# Patient Record
Sex: Female | Born: 1940 | Race: White | Hispanic: No | Marital: Married | Smoking: Never smoker
Health system: Southern US, Community
[De-identification: ages and names within clinical notes are randomized; demographics above are authoritative.]

## PROBLEM LIST (undated history)

## (undated) DIAGNOSIS — I10 Essential (primary) hypertension: Secondary | ICD-10-CM

## (undated) DIAGNOSIS — Z8619 Personal history of other infectious and parasitic diseases: Secondary | ICD-10-CM

## (undated) HISTORY — PX: FOOT SURGERY: SHX648

## (undated) HISTORY — PX: KNEE ARTHROSCOPY: SUR90

## (undated) HISTORY — PX: APPENDECTOMY: SHX54

## (undated) HISTORY — PX: NOSE SURGERY: SHX723

---

## 1966-05-28 HISTORY — PX: NEPHRECTOMY: SHX65

## 2004-05-28 DIAGNOSIS — Z8619 Personal history of other infectious and parasitic diseases: Secondary | ICD-10-CM

## 2004-05-28 HISTORY — DX: Personal history of other infectious and parasitic diseases: Z86.19

## 2008-06-21 ENCOUNTER — Emergency Department: Payer: Self-pay | Admitting: Internal Medicine

## 2008-06-30 ENCOUNTER — Ambulatory Visit: Payer: Self-pay | Admitting: Otolaryngology

## 2010-05-31 ENCOUNTER — Ambulatory Visit: Payer: Self-pay | Admitting: Podiatry

## 2014-09-11 ENCOUNTER — Emergency Department
Admit: 2014-09-11 | Disposition: A | Discharge status: Other Acute Inpatient Hospital | Payer: Self-pay | Admitting: Emergency Medicine

## 2014-09-11 ENCOUNTER — Encounter (HOSPITAL_COMMUNITY): Payer: Self-pay | Admitting: *Deleted

## 2014-09-11 ENCOUNTER — Inpatient Hospital Stay (HOSPITAL_COMMUNITY)
Admission: AD | Admit: 2014-09-11 | Discharge: 2014-09-12 | DRG: 084 | Disposition: A | Discharge status: Home / Self Care | Payer: Medicare Other | Source: Other Acute Inpatient Hospital | Attending: Neurosurgery | Admitting: Neurosurgery

## 2014-09-11 DIAGNOSIS — S065XAA Traumatic subdural hemorrhage with loss of consciousness status unknown, initial encounter: Secondary | ICD-10-CM | POA: Diagnosis present

## 2014-09-11 DIAGNOSIS — S066X9A Traumatic subarachnoid hemorrhage with loss of consciousness of unspecified duration, initial encounter: Secondary | ICD-10-CM | POA: Diagnosis present

## 2014-09-11 DIAGNOSIS — I609 Nontraumatic subarachnoid hemorrhage, unspecified: Secondary | ICD-10-CM

## 2014-09-11 DIAGNOSIS — S065X9A Traumatic subdural hemorrhage with loss of consciousness of unspecified duration, initial encounter: Principal | ICD-10-CM | POA: Diagnosis present

## 2014-09-11 DIAGNOSIS — Y92009 Unspecified place in unspecified non-institutional (private) residence as the place of occurrence of the external cause: Secondary | ICD-10-CM | POA: Diagnosis not present

## 2014-09-11 DIAGNOSIS — W19XXXA Unspecified fall, initial encounter: Secondary | ICD-10-CM | POA: Diagnosis present

## 2014-09-11 DIAGNOSIS — R51 Headache: Secondary | ICD-10-CM | POA: Diagnosis present

## 2014-09-11 HISTORY — DX: Personal history of other infectious and parasitic diseases: Z86.19

## 2014-09-11 LAB — COMPREHENSIVE METABOLIC PANEL
Albumin: 4.1 g/dL
Alkaline Phosphatase: 110 U/L
Anion Gap: 9 (ref 7–16)
BUN: 14 mg/dL
Bilirubin,Total: 0.3 mg/dL
Calcium, Total: 9.3 mg/dL
Chloride: 105 mmol/L
Co2: 24 mmol/L
Creatinine: 0.91 mg/dL
EGFR (African American): >60
EGFR (Non-African Amer.): >60
Glucose: 116 mg/dL — ABNORMAL HIGH
Potassium: 4 mmol/L
SGOT(AST): 27 U/L
SGPT (ALT): 17 U/L
Sodium: 138 mmol/L
Total Protein: 7.5 g/dL

## 2014-09-11 LAB — CBC WITH DIFFERENTIAL/PLATELET
Basophil #: 0.1 10*3/uL (ref 0.0–0.1)
Basophil %: 1.7 %
Eosinophil #: 0.1 10*3/uL (ref 0.0–0.7)
Eosinophil %: 2.9 %
HCT: 40.2 % (ref 35.0–47.0)
HGB: 13.5 g/dL (ref 12.0–16.0)
Lymphocyte #: 1.6 10*3/uL (ref 1.0–3.6)
Lymphocyte %: 42.4 %
MCH: 32.4 pg (ref 26.0–34.0)
MCHC: 33.7 g/dL (ref 32.0–36.0)
MCV: 96 fL (ref 80–100)
Monocyte #: 0.4 x10 3/mm (ref 0.2–0.9)
Monocyte %: 10.2 %
Neutrophil #: 1.7 10*3/uL (ref 1.4–6.5)
Neutrophil %: 42.8 %
Platelet: 273 10*3/uL (ref 150–440)
RBC: 4.18 10*6/uL (ref 3.80–5.20)
RDW: 12.9 % (ref 11.5–14.5)
WBC: 3.9 10*3/uL (ref 3.6–11.0)

## 2014-09-11 LAB — MRSA PCR SCREENING: MRSA by PCR: NEGATIVE

## 2014-09-11 LAB — TROPONIN I: Troponin-I: <0.03 ng/mL

## 2014-09-11 LAB — ETHANOL: Ethanol: 232 mg/dL

## 2014-09-11 MED ORDER — ACETAMINOPHEN 650 MG RE SUPP: 650.0000 mg | RECTAL | Status: DC | PRN

## 2014-09-11 MED ORDER — PANTOPRAZOLE SODIUM 40 MG IV SOLR
40.0000 mg | Freq: Every day | INTRAVENOUS | Status: DC
  Administered 2014-09-11: 40 mg via INTRAVENOUS
  Filled 2014-09-11: qty 40

## 2014-09-11 MED ORDER — LABETALOL HCL 5 MG/ML IV SOLN: 10.0000 mg | INTRAVENOUS | Status: DC | PRN

## 2014-09-11 MED ORDER — ACETAMINOPHEN 325 MG PO TABS
650.0000 mg | ORAL_TABLET | ORAL | Status: DC | PRN
  Administered 2014-09-11: 650 mg via ORAL
  Filled 2014-09-11: qty 2

## 2014-09-11 MED ORDER — SENNOSIDES-DOCUSATE SODIUM 8.6-50 MG PO TABS
1.0000 | ORAL_TABLET | Freq: Two times a day (BID) | ORAL | Status: DC
  Administered 2014-09-11 – 2014-09-12 (×3): 1 via ORAL
  Filled 2014-09-11 (×4): qty 1

## 2014-09-11 MED ORDER — ACETAMINOPHEN-CODEINE #3 300-30 MG PO TABS: 1.0000 | ORAL_TABLET | ORAL | Status: DC | PRN

## 2014-09-11 MED ORDER — STROKE: EARLY STAGES OF RECOVERY BOOK
Freq: Once | Status: DC
  Filled 2014-09-11: qty 1

## 2014-09-11 NOTE — Progress Notes (Signed)
Dr. Wynetta Emeryram notified of pts arrival.

## 2014-09-11 NOTE — Progress Notes (Signed)
Nutrition Brief Note  Patient identified on the Malnutrition Screening Tool (MST) Report. Pt s/p fall with closed head injury. Neurologically stable possible d/c home tomorrow.  Wt Readings from Last 15 Encounters:  09/11/14 160 lb 0.9 oz (72.6 kg)    Body mass index is 25.06 kg/(m^2). Patient meets criteria for overweight based on current BMI.   Current diet order is regular, patient is consuming approximately n/a% of meals at this time. Labs and medications reviewed  No nutrition interventions warranted at this time. If nutrition issues arise, please consult RD.   Andrea ShiversLynn Elfie Costanza MS,RD,CSG,LDN Office: [phone] Pager: [phone]

## 2014-09-11 NOTE — H&P (Signed)
Andrea Contreras is an 74 y.o. female.   Chief Complaint: Closed head injury subdural hematoma subarachnoid hemorrhage HPI: Patient is a 74 year old female who was [facility] after sustaining a fall at home striking her head and having a positive loss of consciousness. Patient is currently amnestic of the event was taken [facility] was worked up CT scan showed a left-sided tentorial subdural hematoma and a small amount of stress traumatic subarachnoid hemorrhage. Patient was transferred for observation. Currently the patient denies any significant headache denies any numbness attending arms or legs denies any neck pain. Workup at the outside hospital was negative for any additional injuries.  Past Medical History  Diagnosis Date  . History of shingles 2006    History reviewed. No pertinent past surgical history.  History reviewed. No pertinent family history. Social History:  reports that she has never smoked. She has never used smokeless tobacco. She reports that she drinks about 4.2 oz of alcohol per week. She reports that she does not use illicit drugs.  Allergies: No Known Allergies  No prescriptions prior to admission    Results for orders placed or performed during the hospital encounter of 09/11/14 (from the past 48 hour(s))  MRSA PCR Screening     Status: None   Collection Time: 09/11/14  3:59 AM  Result Value Ref Range   MRSA by PCR NEGATIVE NEGATIVE    Comment:        The GeneXpert MRSA Assay (FDA approved for NASAL specimens only), is one component of a comprehensive MRSA colonization surveillance program. It is not intended to diagnose MRSA infection nor to guide or monitor treatment for MRSA infections.    No results found.  Review of Systems  Constitutional: Negative.   Eyes: Negative.   Respiratory: Negative.   Cardiovascular: Negative.   Gastrointestinal: Negative.   Genitourinary: Negative.   Musculoskeletal: Positive  for myalgias and neck pain.  Skin: Negative.   Neurological: Positive for headaches.  Psychiatric/Behavioral: Negative.     Blood pressure 141/62, pulse 72, resp. rate 18, height 5\' 7"  (1.702 m), weight 72.6 kg (160 lb 0.9 oz), SpO2 95 %. Physical Exam  Constitutional: She is oriented to person, place, and time. She appears well-developed and well-nourished.  HENT:  Head: Normocephalic.  Eyes: Pupils are equal, round, and reactive to light.  Neck: Normal range of motion.  Respiratory: Effort normal.  GI: Soft.  Neurological: She is alert and oriented to person, place, and time. She has normal strength. GCS eye subscore is 4. GCS verbal subscore is 5. GCS motor subscore is 6.  Patient is awake alert oriented pupils equal extraocular movements are intact cranial nerves are intact strength is 5 out of 5 in her upper and lower extremities  Skin: Skin is warm and dry.     Assessment/Plan 74 year old female status post ground-level fall with tentorial subdural and traumatic subarachnoid hemorrhage neurologically stable with transferred to the floor and observed 24 hours probable discharge tomorrow  Andrea Contreras 09/11/2014, 6:01 AM

## 2014-09-11 NOTE — Progress Notes (Signed)
UR completed 

## 2014-09-12 MED ORDER — PANTOPRAZOLE SODIUM 40 MG PO TBEC: 40.0000 mg | DELAYED_RELEASE_TABLET | Freq: Every day | ORAL | Status: DC

## 2014-09-12 NOTE — Discharge Summary (Addendum)
  Physician Discharge Summary  Patient ID: Belia HemanMarie C Tesch MRN: [id number] DOB/AGE: 76-17-1942 76 y.o.  Admit date: 09/11/2014 Discharge date: 09/12/2014  Admission Diagnoses: Closed head injury small subdural hematoma  Discharge Diagnoses: Same Active Problems:   Subarachnoid hemorrhage   SDH (subdural hematoma)   Discharged Condition: good  Hospital Course: Patient was admitted to the hospital for closed head injury and subdural hematoma patient was observed with serial CTs and showed stable patient was neurologically intact with minimal headache patient stable for discharge home.   Consults: Significant Diagnostic Studies: Treatments: Observation Discharge Exam: Blood pressure 125/49, pulse 89, temperature 97.8 F (36.6 C), temperature source Oral, resp. rate 20, height 5\' 7"  (1.702 m), weight 72.6 kg (160 lb 0.9 oz), SpO2 96 %. Strength 5 out of 5 awake alert and oriented  Disposition: Home     Medication List    TAKE these medications        acetaminophen 500 MG tablet  Commonly known as:  TYLENOL  Take 1,000 mg by mouth daily as needed for moderate pain.     MURINE TEARS FOR DRY EYES OP  Place 1 drop into both eyes daily.     PRESCRIPTION MEDICATION  Apply 1 application topically daily as needed (itching and rash).           Follow-up Information    [facility].   Specialty:  Neurosurgery   Contact information:   [address] [place] [address] [phone]       Signed: Mariam DollarCRAM,Bartt Gonzaga P 09/12/2014, 9:09 AM

## 2014-09-12 NOTE — Progress Notes (Signed)
D/C orders received, pt for D/C home today.  IV and telemetry D/C.  Rx and D/C instructions given with verbalized understanding.  Family at bedside to assist with D/C.  Staff brought pt downstairs via wheelchair.  

## 2014-09-12 NOTE — Progress Notes (Signed)
Patient ID: Andrea Contreras, female   DOB: 22-Oct-1940, 74 y.o.   MRN: [id number] Patient doing well minimal headache  Strength 5 out of 5 neurologic intact  Discharge home

## 2015-01-03 ENCOUNTER — Other Ambulatory Visit: Payer: Self-pay | Admitting: Otolaryngology

## 2015-01-03 DIAGNOSIS — H9193 Unspecified hearing loss, bilateral: Secondary | ICD-10-CM

## 2015-01-08 ENCOUNTER — Ambulatory Visit
Admission: RE | Admit: 2015-01-08 | Discharge: 2015-01-08 | Disposition: A | Discharge status: Home / Self Care | Payer: Medicare Other | Source: Ambulatory Visit | Attending: Otolaryngology | Admitting: Otolaryngology

## 2015-01-08 DIAGNOSIS — H748X2 Other specified disorders of left middle ear and mastoid: Secondary | ICD-10-CM | POA: Insufficient documentation

## 2015-01-08 DIAGNOSIS — H9193 Unspecified hearing loss, bilateral: Secondary | ICD-10-CM

## 2015-01-08 DIAGNOSIS — I679 Cerebrovascular disease, unspecified: Secondary | ICD-10-CM | POA: Diagnosis not present

## 2015-01-08 MED ORDER — GADOBENATE DIMEGLUMINE 529 MG/ML IV SOLN
10.0000 mL | Freq: Once | INTRAVENOUS | Status: AC | PRN
  Administered 2015-01-08: 7 mL via INTRAVENOUS

## 2016-03-13 ENCOUNTER — Ambulatory Visit (INDEPENDENT_AMBULATORY_CARE_PROVIDER_SITE_OTHER): Payer: Medicare Other | Admitting: Vascular Surgery

## 2016-03-13 ENCOUNTER — Encounter (INDEPENDENT_AMBULATORY_CARE_PROVIDER_SITE_OTHER): Payer: Self-pay | Admitting: Vascular Surgery

## 2016-03-13 VITALS — BP 182/85 | HR 83 | Resp 16 | Ht 67.5 in | Wt 162.0 lb

## 2016-03-13 DIAGNOSIS — I83813 Varicose veins of bilateral lower extremities with pain: Secondary | ICD-10-CM

## 2016-03-13 DIAGNOSIS — I83812 Varicose veins of left lower extremities with pain: Secondary | ICD-10-CM | POA: Diagnosis not present

## 2016-03-13 NOTE — Assessment & Plan Note (Signed)
Sclerotherapy today

## 2016-03-13 NOTE — Progress Notes (Signed)
Procedure: Hypertonic Saline Sclerotherapy Left  Indication: Patient presents with symptomatic varicose veins of the Left  Procedure: Sclerotherapy using hypertonic saline mixed with 1% Lidocaine was performed on the Left. Compression wraps were placed. The patient tolerated the procedure well.  Plan: Follow up as needed.   

## 2016-03-19 ENCOUNTER — Telehealth (INDEPENDENT_AMBULATORY_CARE_PROVIDER_SITE_OTHER): Payer: Self-pay

## 2016-03-19 NOTE — Telephone Encounter (Signed)
Patient called with questions sclerotherapy and laser dates and all her questions were answer.

## 2016-07-06 ENCOUNTER — Ambulatory Visit (INDEPENDENT_AMBULATORY_CARE_PROVIDER_SITE_OTHER): Payer: Medicare Other | Admitting: Vascular Surgery

## 2016-07-06 ENCOUNTER — Encounter (INDEPENDENT_AMBULATORY_CARE_PROVIDER_SITE_OTHER): Payer: Self-pay | Admitting: Vascular Surgery

## 2016-07-06 VITALS — BP 171/79 | HR 97 | Resp 16 | Ht 67.5 in | Wt 159.0 lb

## 2016-07-06 DIAGNOSIS — I83813 Varicose veins of bilateral lower extremities with pain: Secondary | ICD-10-CM | POA: Diagnosis not present

## 2016-07-06 NOTE — Assessment & Plan Note (Signed)
The patient is still bothered by residual varicosities in both lower extremities, worse on the left than the right. She has had improvement with sclerotherapy but certainly not resolution. We discussed having more sclerotherapy treatments for residual venous disease. Risks and benefits were discussed and she desires to proceed.

## 2016-07-06 NOTE — Progress Notes (Signed)
MRN : [id number]  Andrea Contreras is a 76 y.o. ([birth year]) female who presents with chief complaint of  Chief Complaint  Patient presents with  . Re-evaluation    Wants to be seen for sclero issue, veins are back  .  History of Present Illness: Patient returns in follow up for Varicose veins. She has undergone extensive venous treatments with multiple sclerotherapy treatments over the past several months. The last one was a couple of months ago. She still has some dark spots from her treatments as well as a few firm indurated areas from the larger veins. She is still bothered by some residual painful varicosities in both lower extremities. The left seems to have more remaining than the right..    Past Medical History:  Diagnosis Date  . History of shingles 2006    No past surgical history on file.  Social History Social History  Substance Use Topics  . Smoking status: Never Smoker  . Smokeless tobacco: Never Used  . Alcohol use 4.2 oz/week    7 Glasses of wine per week  Married  Family History No bleeding or clotting disorders  Current Outpatient Prescriptions  Medication Sig Dispense Refill  . acetaminophen (TYLENOL) 500 MG tablet Take 1,000 mg by mouth daily as needed for moderate pain.    Marland Kitchen ibuprofen (ADVIL,MOTRIN) 200 MG tablet Take 200 mg by mouth every 6 (six) hours as needed.    . Polyvinyl Alcohol-Povidone (MURINE TEARS FOR DRY EYES OP) Place 1 drop into both eyes daily.    Marland Kitchen PRESCRIPTION MEDICATION Apply 1 application topically daily as needed (itching and rash).     No current facility-administered medications for this visit.     No Known Allergies   REVIEW OF SYSTEMS (Negative unless checked)  Constitutional: [] Weight loss  [] Fever  [] Chills Cardiac: [] Chest pain   [] Chest pressure   [] Palpitations   [] Shortness of breath when laying flat   [] Shortness of breath at rest   [] Shortness of breath with exertion. Vascular:  [] Pain in legs with walking    [x] Pain in legs at rest   [] Pain in legs when laying flat   [] Claudication   [] Pain in feet when walking  [] Pain in feet at rest  [] Pain in feet when laying flat   [] History of DVT   [] Phlebitis   [] Swelling in legs   [x] Varicose veins   [] Non-healing ulcers Pulmonary:   [] Uses home oxygen   [] Productive cough   [] Hemoptysis   [] Wheeze  [] COPD    Neurologic:  [] Dizziness  [] Blackouts   [] Seizures   [] History of stroke   [] History of TIA  [] Aphasia   [] Temporary blindness   [] Dysphagia   [] Weakness or numbness in arms   [] Weakness or numbness in legs Musculoskeletal:  [] Arthritis   [] Joint swelling   [] Joint pain   [] Low back pain Hematologic:  [] Easy bruising  [] Easy bleeding   [] Hypercoagulable state   [] Anemic  [] Thrombocytopenia Gastrointestinal:  [] Blood in stool   [] Vomiting blood  [] Gastroesophageal reflux/heartburn   [] Difficulty swallowing. Genitourinary:  [] Chronic kidney disease   [] Difficult urination  [] Frequent urination  [] Burning with urination   [] Blood in urine Skin:  [] Rashes   [] Ulcers   [] Wounds Psychological:  [] History of anxiety   []  History of major depression.  Physical Examination  Vitals:   07/06/16 1147  BP: (!) 171/79  Pulse: 97  Resp: 16  Weight: 159 lb (72.1 kg)  Height: 5' 7.5" (1.715 m)   Body mass index  is 24.54 kg/m. Gen:  WD/WN, NAD, appears Younger than stated age Head: [place]/AT, No temporalis wasting. Ear/Nose/Throat: Hearing grossly intact, trachea midline Eyes: Conjunctiva clear. Sclera non-icteric Neck: Supple.  No JVD. Trachea midline Pulmonary:  Good air movement, respirations not labored, no use of accessory muscles.  Cardiac: RRR, normal S1, S2. Vascular:  Vessel Right Left  Radial Palpable Palpable                                   Gastrointestinal: soft, non-tender/non-distended. No guarding/reflex.  Musculoskeletal: M/S 5/5 throughout.  No deformity or atrophy. No edema. Scattered varicosities bilaterally worse on the left than  the right. Largest in size appear to be 2-3 mm. Neurologic: Sensation grossly intact in extremities.  Symmetrical.  Speech is fluent. Psychiatric: Judgment intact, Mood & affect appropriate for pt's clinical situation. Dermatologic: No rashes or ulcers noted.  No cellulitis or open wounds. Lymph : No Cervical, Axillary, or Inguinal lymphadenopathy.     Labs No results found for this or any previous visit (from the past 2160 hour(s)).  Radiology No results found.    Assessment/Plan  Varicose veins of both lower extremities with pain The patient is still bothered by residual varicosities in both lower extremities, worse on the left than the right. She has had improvement with sclerotherapy but certainly not resolution. We discussed having more sclerotherapy treatments for residual venous disease. Risks and benefits were discussed and she desires to proceed.    Festus BarrenJason Nyjah Schwake, MD  07/06/2016 1:22 PM    This note was created with Dragon medical transcription system.  Any errors from dictation are purely unintentional

## 2016-07-06 NOTE — Patient Instructions (Signed)
Varicose Vein Surgery Varicose vein surgery is a procedure to remove or repair varicose veins. These are swollen, twisted veins that are visible under the skin, especially in your legs. These veins may appear blue and bulging. All veins have a valve that keeps blood flowing in only one direction. If these valves get weak or damaged, blood can pool and cause varicose veins. You may have varicose vein surgery if your varicose veins are causing symptoms or complications, or if lifestyle changes have not helped. The surgery can reduce pain, aching, and the risk of bleeding and blood clots. Surgery can also improve the way the affected area looks (cosmetic appearance). The different types of varicose vein surgery include:  Injecting a chemical to close off a vein (sclerotherapy).  Treating a vein with light energy (laser treatment).  Using lasers or radio waves to close off a vein with heat (radiofrequency vein ablation).  Surgically removing the vein through a small incision (phlebectomy).  Surgically removing the vein through incisions after the vein has been tied off (vein ligation and stripping). Your health care provider will discuss the method that is best for you based on your condition. Tell a health care provider about:  Any allergies you have.  All medicines you are taking, including vitamins, herbs, eye drops, creams, and over-the-counter medicines.  Any problems you or family members have had with anesthetic medicines.  Any blood disorders you have.  Any surgeries you have had.  Any medical conditions you have. What are the risks? Generally, this is a safe procedure. However, problems can occur and include:  Damage to nearby nerves, tissues, or veins.  Sores.  Dark spots.  Skin irritation.  Numbness.  Clotting.  Infection.  Scarring.  Leg swelling.  Need for additional treatments. What happens before the procedure?  Ask your health care provider  about:  Changing or stopping your regular medicines. This is especially important if you are taking diabetes medicines or blood thinners.  Taking medicines such as aspirin and ibuprofen. These medicines can thin your blood. Do not take these medicines before your procedure if your health care provider instructs you not to.  You may have tests before varicose vein surgery. These can include a test to:  Check for clots and check blood flow using sound waves (Doppler ultrasound).  Observe how blood flows through your veins by injecting a dye that outlines your veins on X-rays (angiogram). This test is used in rare cases. What happens during the procedure? The procedure will vary depending on which type of varicose vein surgery you have: Sclerotherapy  This procedure is often used for small to medium veins.  A chemical (sclerosant) that irritates the lining of the vein will be injected into the vein. This will cause the varicose vein to be closed off.  Sclerosants in different amounts and strengths can be used, depending on the size and location of the vein.  You may need more than one treatment. Laser Treatment  This procedure does not involve incisions or chemicals.  Light energy from a laser will be directed onto the vein.  Laser treatment may be combined with sclerotherapy.  You may need more than one treatment. Radiofrequency Vein Ablation  You will be given a medicine that numbs the area (local anesthetic).  A small surgical cut (incision) will be made near the varicose vein.  A narrow tube (catheter) will be threaded into your vein.  The tip of the catheter will use either a laser or radio waves   to close the vein with heat. Phlebectomy  This surgical procedure is used to remove the veins closest to the skin.  You will be given a medicine that numbs the area (local anesthetic).  The surgeon will make a small puncture close to the varicose vein and then use a tiny hook  to pull out the vein. Vein Ligation and Stripping  This surgical procedure is used to treat severe cases.  For this procedure, you will be given a medicine that makes you go to sleep (general anesthetic).  The surgeon will make a small incision near the vein in your groin (saphenous vein).  First the surgeon will tie off (ligate) the vein.  Then the surgeon will make several more incisions along the vein.  The vein will be removed (stripped).  It may take 1-4 weeks to recover completely. What happens after the procedure?  Depending on the type of procedure that is performed, you may be able to return to your usual activities the day after the procedure.  You may have to wear compression stockings. These stockings help to prevent blood clots and reduce swelling in your legs. This information is not intended to replace advice given to you by your health care provider. Make sure you discuss any questions you have with your health care provider. Document Released: 06/03/2007 Document Revised: 10/20/2015 Document Reviewed: 10/21/2013 Elsevier Interactive Patient Education  2017 [employer] Inc[employer]  

## 2016-07-31 ENCOUNTER — Ambulatory Visit (INDEPENDENT_AMBULATORY_CARE_PROVIDER_SITE_OTHER): Payer: Medicare Other | Admitting: Vascular Surgery

## 2016-07-31 ENCOUNTER — Encounter (INDEPENDENT_AMBULATORY_CARE_PROVIDER_SITE_OTHER): Payer: Self-pay | Admitting: Vascular Surgery

## 2016-07-31 VITALS — BP 190/88 | HR 82 | Resp 16 | Ht 67.5 in | Wt 160.0 lb

## 2016-07-31 DIAGNOSIS — I83811 Varicose veins of right lower extremities with pain: Secondary | ICD-10-CM | POA: Diagnosis not present

## 2016-07-31 DIAGNOSIS — I83813 Varicose veins of bilateral lower extremities with pain: Secondary | ICD-10-CM

## 2016-07-31 DIAGNOSIS — I83812 Varicose veins of left lower extremities with pain: Secondary | ICD-10-CM | POA: Diagnosis not present

## 2016-07-31 NOTE — Progress Notes (Signed)
Procedure: Hypertonic Saline Sclerotherapy Bilateral  Indication: Patient presents with symptomatic varicose veins of both lower extremities  Procedure: Sclerotherapy using hypertonic saline mixed with 1% Lidocaine was performed on Bilateral lower extremities. Compression wraps were placed. The patient tolerated the procedure well.  Plan: Follow up as needed.

## 2017-02-08 IMAGING — MR MR BRAIN/IAC WO/W
7 of 11 series · 33 of 48 positions shown · IV contrast (multihance)
Comparison: Head CT 09/11/2014

CLINICAL DATA: Left-sided hearing loss.

EXAM:
MR BRAIN/IAC WITHOUT AND WITH CONTRAST
TECHNIQUE: Multiplanar, multisequence MR imaging was performed both before and
after administration of intravenous contrast.
CONTRAST:  7mL MULTIHANCE GADOBENATE DIMEGLUMINE 529 MG/ML IV SOLN

[Series 2: T1 · sagittal · 5.0mm · 0.45mm/px · 5 of 27 slices shown (1 of 2)]
[im 1/27]
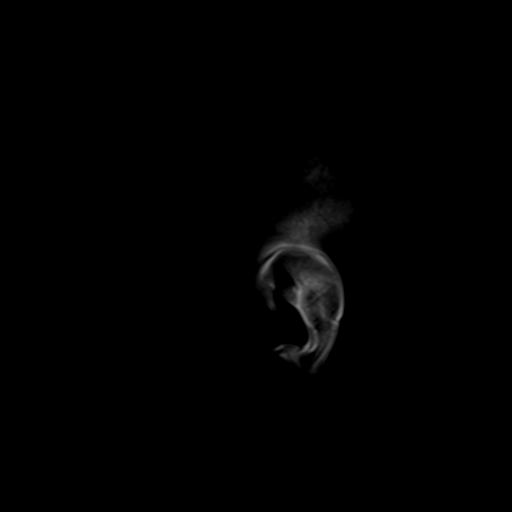
[im 7/27]
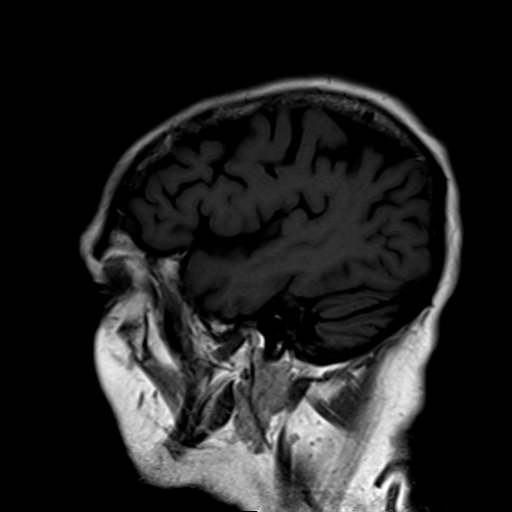
[im 14/27]
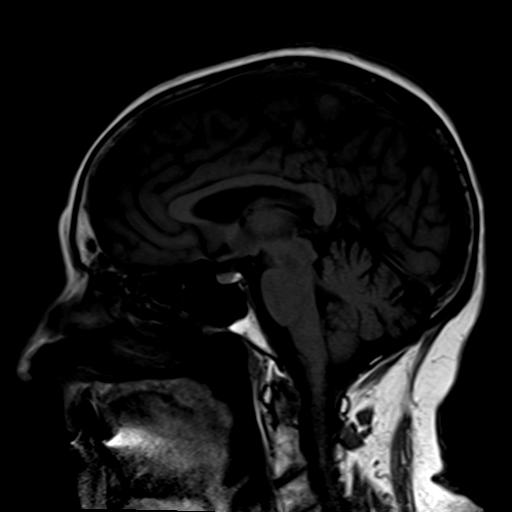
[im 20/27]
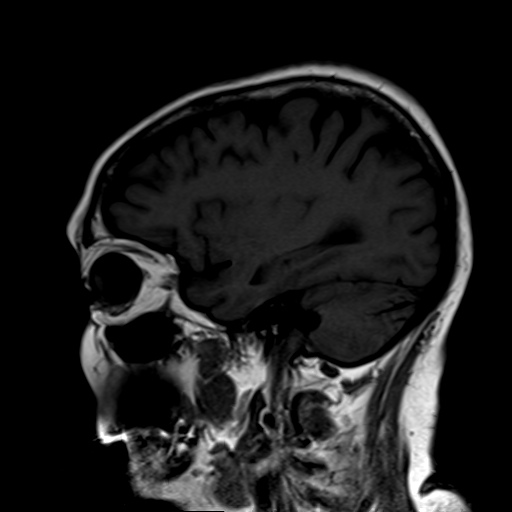
[im 27/27]
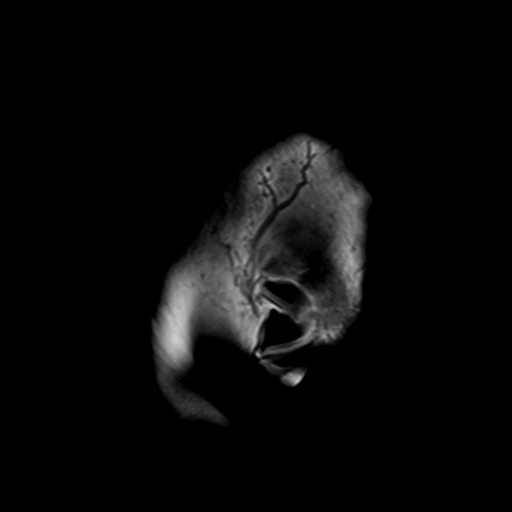

[Series 4: DWI · axial · 3.0mm · 1.80mm/px · z∈[-54,+102]mm · 6 of 41 slices shown (1 of 2)]
[im 1/41]
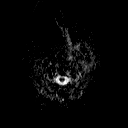
[im 9/41]
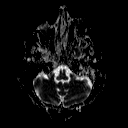
[im 17/41]
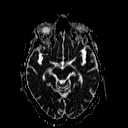
[im 25/41]
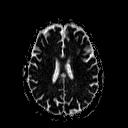
[im 33/41]
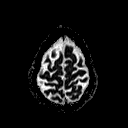
[im 41/41]
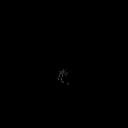

[Series 5: T2 · axial · 5.0mm · 0.60mm/px · z∈[-55,+107]mm · 4 of 26 slices shown (1 of 2)]
[im 1/26]
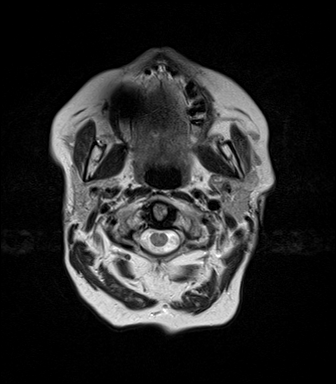
[im 9/26]
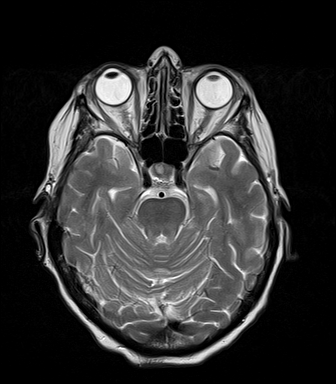
[im 17/26]
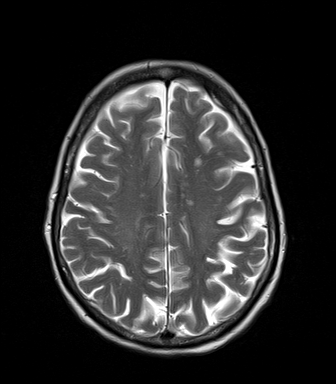
[im 26/26]
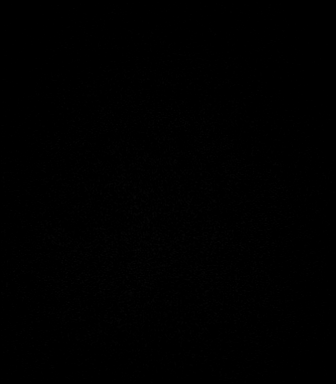

[Series 6: FLAIR · axial · 5.0mm · 0.45mm/px · z∈[-55,+107]mm · 4 of 26 slices shown]
[im 1/26]
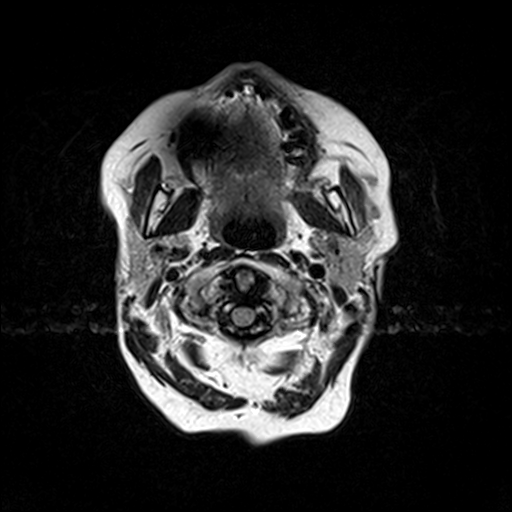
[im 9/26]
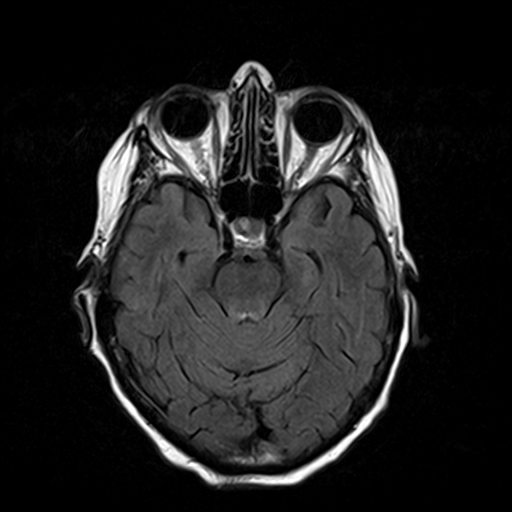
[im 17/26]
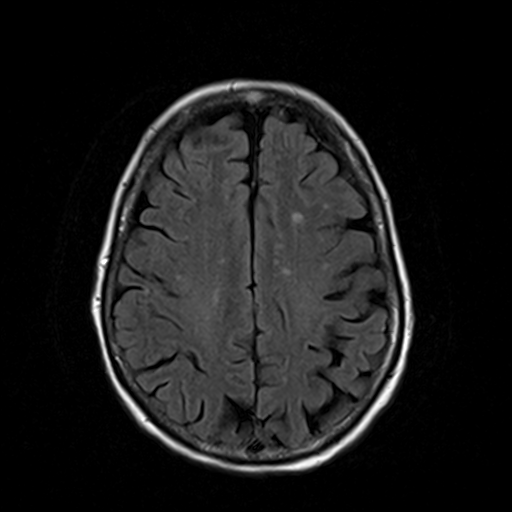
[im 26/26]
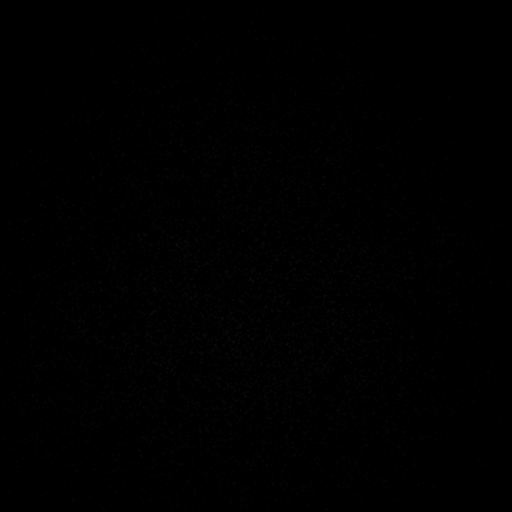

[Series 7: T1 · coronal · 3.0mm · 0.78mm/px · 2 of 15 slices shown (2 of 2)]
[im 1/15]
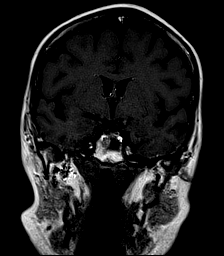
[im 15/15]
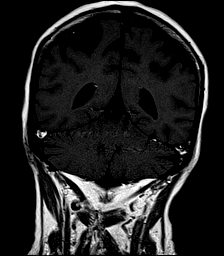

[Series 8: T2 · axial · 1.0mm · 0.35mm/px · z∈[-27,+12]mm · 6 of 40 slices shown (2 of 2)]
[im 1/40]
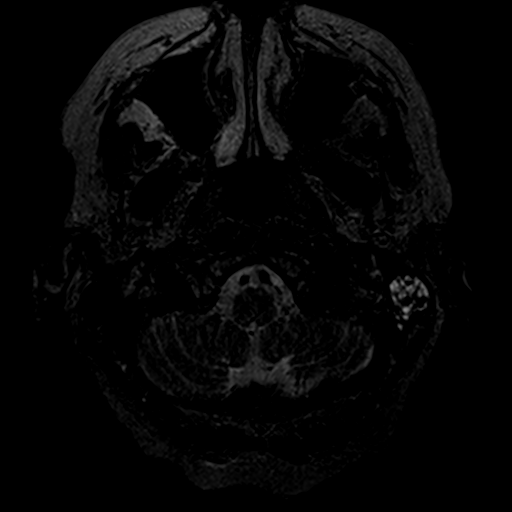
[im 8/40]
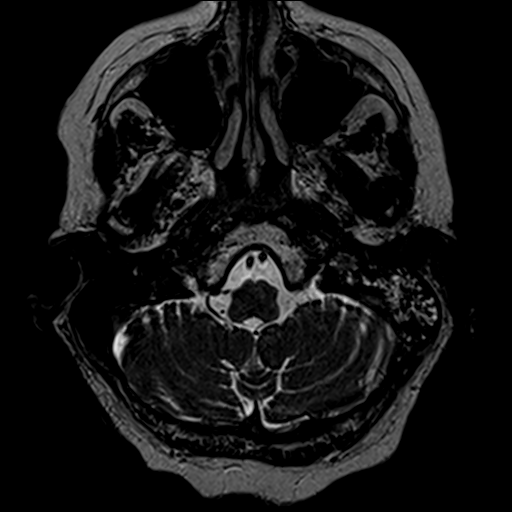
[im 16/40]
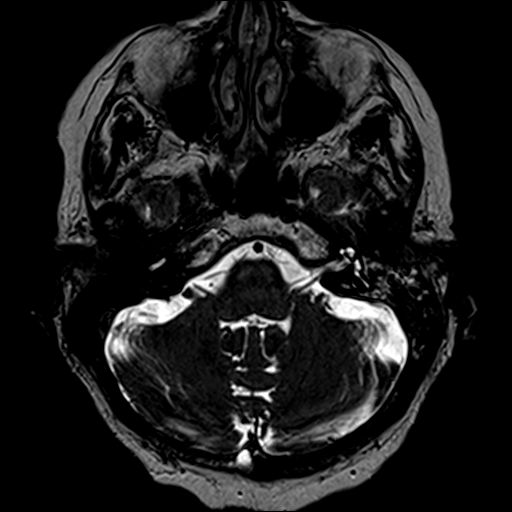
[im 24/40]
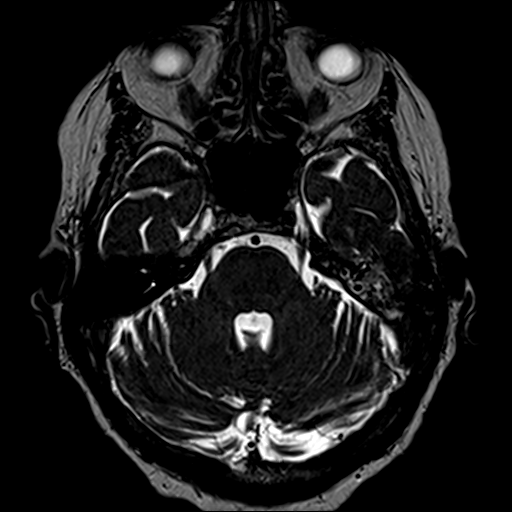
[im 32/40]
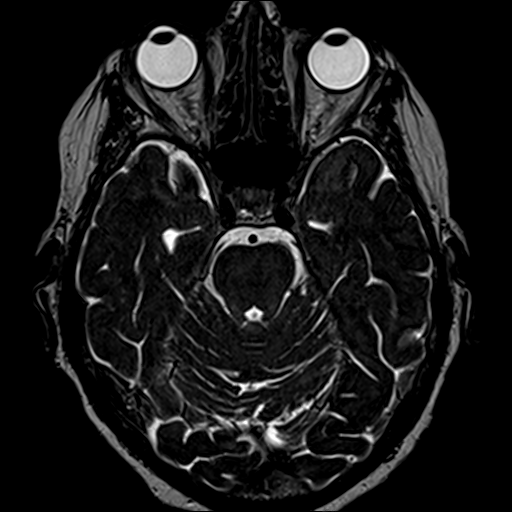
[im 40/40]
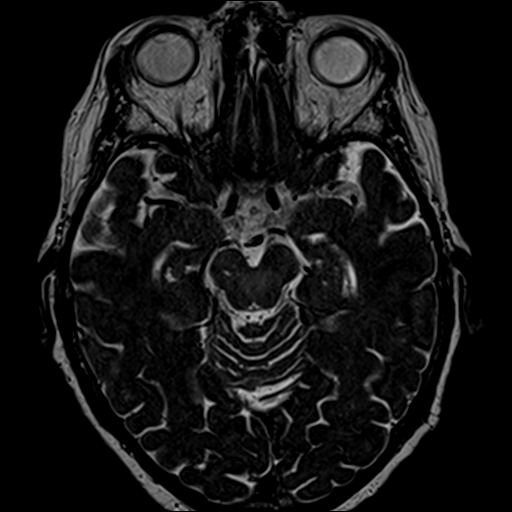

[Series 100: DWI · axial · 3.0mm · 1.80mm/px · z∈[-54,+106]mm · 6 of 42 slices shown (2 of 2)]
[im 1/42]
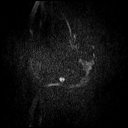
[im 9/42]
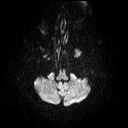
[im 17/42]
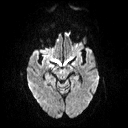
[im 25/42]
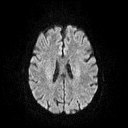
[im 33/42]
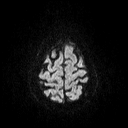
[im 42/42]
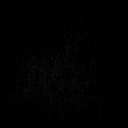

[33 of 48 positions shown; findings below may reference images not displayed]

FINDINGS: There is no evidence of acute infarct, intracranial hemorrhage,
mass, midline shift, or extra-axial fluid collection. Small foci of
T2 hyperintensity throughout the subcortical and deep cerebral white
matter bilaterally are nonspecific but compatible with mild chronic
small vessel ischemic disease. Ventricles and sulci are normal.

Orbits are unremarkable. Paranasal sinuses and right mastoid air
cells are clear. There is a moderate left mastoid effusion with a
small amount of associated enhancement. There is also some fluid in
the left middle ear.

Dedicated imaging through the internal auditory canals demonstrates
a normal course of cranial nerves VII and VIII without evidence of
mass or abnormal enhancement. Inner ear structures demonstrate
normal signal bilaterally. No mass is seen within the
cerebellopontine angles.
IMPRESSION: 1. Moderate left mastoid effusion and left middle ear fluid.
2. No evidence of vestibular schwannoma.
3. Mild chronic small vessel ischemic disease in the cerebral white
matter.

## 2018-02-06 DIAGNOSIS — H40003 Preglaucoma, unspecified, bilateral: Secondary | ICD-10-CM | POA: Diagnosis not present

## 2018-11-11 ENCOUNTER — Telehealth (INDEPENDENT_AMBULATORY_CARE_PROVIDER_SITE_OTHER): Payer: Self-pay | Admitting: Vascular Surgery

## 2018-11-13 ENCOUNTER — Telehealth (INDEPENDENT_AMBULATORY_CARE_PROVIDER_SITE_OTHER): Payer: Self-pay | Admitting: Vascular Surgery

## 2018-11-13 NOTE — Telephone Encounter (Signed)
Patient stated the right leg has been red and scaly for 6 months or more around the middle on the outside of the leg  since the last sclerotherapy. She stated that it was clearing up but started coming back and she has been using cortisone and alcohol to help clear it up

## 2018-11-13 NOTE — Telephone Encounter (Signed)
Patient has been made aware with Arna Medici NP medical recommendations

## 2018-11-13 NOTE — Telephone Encounter (Signed)
She should stop using alcohol and cortisone at that spot.  The alcohol is very drying to the skin and the cortisone makes the skin thin, especially if she is using it daily.  The combination of those products may be causing the redness.  Also, she may have some stasis dermatitis which is a long term effect of varicose veins and is generally not reversible.  At this stage I'm not certain that there is any further intervention that we would be able to perform which would relieve that area.  I would suggest speaking with a dermatologist as they may have more options to give.

## 2018-11-14 DIAGNOSIS — L309 Dermatitis, unspecified: Secondary | ICD-10-CM | POA: Diagnosis not present

## 2018-12-01 DIAGNOSIS — L309 Dermatitis, unspecified: Secondary | ICD-10-CM | POA: Diagnosis not present

## 2018-12-17 DIAGNOSIS — M2012 Hallux valgus (acquired), left foot: Secondary | ICD-10-CM | POA: Diagnosis not present

## 2018-12-17 DIAGNOSIS — M1712 Unilateral primary osteoarthritis, left knee: Secondary | ICD-10-CM | POA: Diagnosis not present

## 2019-01-02 DIAGNOSIS — L309 Dermatitis, unspecified: Secondary | ICD-10-CM | POA: Diagnosis not present

## 2019-02-13 DIAGNOSIS — L309 Dermatitis, unspecified: Secondary | ICD-10-CM | POA: Diagnosis not present

## 2019-02-13 DIAGNOSIS — L810 Postinflammatory hyperpigmentation: Secondary | ICD-10-CM | POA: Diagnosis not present

## 2019-04-07 DIAGNOSIS — H2513 Age-related nuclear cataract, bilateral: Secondary | ICD-10-CM | POA: Diagnosis not present

## 2019-04-07 DIAGNOSIS — H1012 Acute atopic conjunctivitis, left eye: Secondary | ICD-10-CM | POA: Diagnosis not present

## 2019-04-10 DIAGNOSIS — L309 Dermatitis, unspecified: Secondary | ICD-10-CM | POA: Diagnosis not present

## 2019-04-10 DIAGNOSIS — L810 Postinflammatory hyperpigmentation: Secondary | ICD-10-CM | POA: Diagnosis not present

## 2020-01-13 DIAGNOSIS — H25013 Cortical age-related cataract, bilateral: Secondary | ICD-10-CM | POA: Diagnosis not present

## 2020-02-19 DIAGNOSIS — H40003 Preglaucoma, unspecified, bilateral: Secondary | ICD-10-CM | POA: Diagnosis not present

## 2020-03-03 DIAGNOSIS — Z23 Encounter for immunization: Secondary | ICD-10-CM | POA: Diagnosis not present

## 2020-04-04 DIAGNOSIS — H2512 Age-related nuclear cataract, left eye: Secondary | ICD-10-CM | POA: Diagnosis not present

## 2020-04-04 DIAGNOSIS — I10 Essential (primary) hypertension: Secondary | ICD-10-CM | POA: Diagnosis not present

## 2020-04-06 ENCOUNTER — Other Ambulatory Visit: Payer: Self-pay | Admitting: Ophthalmology

## 2020-04-19 ENCOUNTER — Ambulatory Visit: Payer: Self-pay | Admitting: Family Medicine

## 2020-04-20 ENCOUNTER — Ambulatory Visit: Admission: RE | Admit: 2020-04-20 | Payer: Medicare Other | Source: Home / Self Care | Admitting: Ophthalmology

## 2020-04-20 ENCOUNTER — Encounter: Admission: RE | Payer: Self-pay | Source: Home / Self Care

## 2020-04-20 DIAGNOSIS — I8390 Asymptomatic varicose veins of unspecified lower extremity: Secondary | ICD-10-CM | POA: Diagnosis not present

## 2020-04-20 DIAGNOSIS — I83893 Varicose veins of bilateral lower extremities with other complications: Secondary | ICD-10-CM | POA: Diagnosis not present

## 2020-04-20 DIAGNOSIS — H269 Unspecified cataract: Secondary | ICD-10-CM | POA: Diagnosis not present

## 2020-04-20 DIAGNOSIS — Z905 Acquired absence of kidney: Secondary | ICD-10-CM | POA: Diagnosis not present

## 2020-04-20 DIAGNOSIS — Z0000 Encounter for general adult medical examination without abnormal findings: Secondary | ICD-10-CM | POA: Diagnosis not present

## 2020-04-20 DIAGNOSIS — R6884 Jaw pain: Secondary | ICD-10-CM | POA: Diagnosis not present

## 2020-04-20 DIAGNOSIS — I10 Essential (primary) hypertension: Secondary | ICD-10-CM | POA: Diagnosis not present

## 2020-04-20 SURGERY — PHACOEMULSIFICATION, CATARACT, WITH IOL INSERTION
Anesthesia: Topical | Laterality: Left

## 2020-04-28 DIAGNOSIS — I10 Essential (primary) hypertension: Secondary | ICD-10-CM | POA: Diagnosis not present

## 2020-04-28 DIAGNOSIS — M542 Cervicalgia: Secondary | ICD-10-CM | POA: Diagnosis not present

## 2020-05-04 DIAGNOSIS — M5030 Other cervical disc degeneration, unspecified cervical region: Secondary | ICD-10-CM | POA: Diagnosis not present

## 2020-05-04 DIAGNOSIS — R6884 Jaw pain: Secondary | ICD-10-CM | POA: Diagnosis not present

## 2020-05-04 DIAGNOSIS — I10 Essential (primary) hypertension: Secondary | ICD-10-CM | POA: Diagnosis not present

## 2020-05-18 DIAGNOSIS — I10 Essential (primary) hypertension: Secondary | ICD-10-CM | POA: Diagnosis not present

## 2020-06-06 DIAGNOSIS — H2513 Age-related nuclear cataract, bilateral: Secondary | ICD-10-CM | POA: Diagnosis not present

## 2020-07-06 DIAGNOSIS — I10 Essential (primary) hypertension: Secondary | ICD-10-CM | POA: Diagnosis not present

## 2020-07-14 ENCOUNTER — Other Ambulatory Visit: Payer: Self-pay

## 2020-07-14 ENCOUNTER — Encounter: Payer: Self-pay | Admitting: Ophthalmology

## 2020-07-18 ENCOUNTER — Other Ambulatory Visit
Admission: RE | Admit: 2020-07-18 | Discharge: 2020-07-18 | Disposition: A | Discharge status: Home / Self Care | Payer: Medicare Other | Source: Ambulatory Visit | Attending: Ophthalmology | Admitting: Ophthalmology

## 2020-07-18 ENCOUNTER — Other Ambulatory Visit: Payer: Self-pay

## 2020-07-18 DIAGNOSIS — Z20822 Contact with and (suspected) exposure to covid-19: Secondary | ICD-10-CM | POA: Diagnosis not present

## 2020-07-18 DIAGNOSIS — Z01812 Encounter for preprocedural laboratory examination: Secondary | ICD-10-CM | POA: Insufficient documentation

## 2020-07-18 LAB — SARS CORONAVIRUS 2 (TAT 6-24 HRS): SARS Coronavirus 2: NEGATIVE

## 2020-07-18 NOTE — Discharge Instructions (Signed)

## 2020-07-20 ENCOUNTER — Other Ambulatory Visit: Payer: Self-pay

## 2020-07-20 ENCOUNTER — Encounter
Admission: RE | Disposition: A | Discharge status: Home / Self Care | Payer: Self-pay | Source: Home / Self Care | Attending: Ophthalmology

## 2020-07-20 ENCOUNTER — Ambulatory Visit: Payer: Medicare Other | Admitting: Anesthesiology

## 2020-07-20 ENCOUNTER — Encounter: Payer: Self-pay | Admitting: Ophthalmology

## 2020-07-20 ENCOUNTER — Ambulatory Visit
Admission: RE | Admit: 2020-07-20 | Discharge: 2020-07-20 | Disposition: A | Discharge status: Home / Self Care | Payer: Medicare Other | Attending: Ophthalmology | Admitting: Ophthalmology

## 2020-07-20 DIAGNOSIS — H25812 Combined forms of age-related cataract, left eye: Secondary | ICD-10-CM | POA: Diagnosis not present

## 2020-07-20 DIAGNOSIS — H2512 Age-related nuclear cataract, left eye: Secondary | ICD-10-CM | POA: Diagnosis not present

## 2020-07-20 DIAGNOSIS — Z79899 Other long term (current) drug therapy: Secondary | ICD-10-CM | POA: Insufficient documentation

## 2020-07-20 HISTORY — PX: CATARACT EXTRACTION W/PHACO: SHX586

## 2020-07-20 HISTORY — DX: Essential (primary) hypertension: I10

## 2020-07-20 SURGERY — PHACOEMULSIFICATION, CATARACT, WITH IOL INSERTION
Anesthesia: Monitor Anesthesia Care | Site: Eye | Laterality: Left

## 2020-07-20 MED ORDER — MIDAZOLAM HCL 2 MG/2ML IJ SOLN
INTRAMUSCULAR | Status: DC | PRN
  Administered 2020-07-20: 1 mg via INTRAVENOUS

## 2020-07-20 MED ORDER — CEFUROXIME OPHTHALMIC INJECTION 1 MG/0.1 ML
INJECTION | OPHTHALMIC | Status: DC | PRN
  Administered 2020-07-20: 0.1 mL via INTRACAMERAL

## 2020-07-20 MED ORDER — LACTATED RINGERS IV SOLN: INTRAVENOUS | Status: DC

## 2020-07-20 MED ORDER — LIDOCAINE HCL (PF) 2 % IJ SOLN
INTRAOCULAR | Status: DC | PRN
  Administered 2020-07-20: 2 mL

## 2020-07-20 MED ORDER — BRIMONIDINE TARTRATE-TIMOLOL 0.2-0.5 % OP SOLN
OPHTHALMIC | Status: DC | PRN
  Administered 2020-07-20: 1 [drp] via OPHTHALMIC

## 2020-07-20 MED ORDER — TETRACAINE HCL 0.5 % OP SOLN
1.0000 [drp] | OPHTHALMIC | Status: DC | PRN
  Administered 2020-07-20 (×3): 1 [drp] via OPHTHALMIC

## 2020-07-20 MED ORDER — EPINEPHRINE PF 1 MG/ML IJ SOLN
INTRAOCULAR | Status: DC | PRN
  Administered 2020-07-20: 62 mL via OPHTHALMIC

## 2020-07-20 MED ORDER — NA HYALUR & NA CHOND-NA HYALUR 0.4-0.35 ML IO KIT
PACK | INTRAOCULAR | Status: DC | PRN
  Administered 2020-07-20: 1 mL via INTRAOCULAR

## 2020-07-20 MED ORDER — FENTANYL CITRATE (PF) 100 MCG/2ML IJ SOLN
INTRAMUSCULAR | Status: DC | PRN
  Administered 2020-07-20: 50 ug via INTRAVENOUS

## 2020-07-20 MED ORDER — ARMC OPHTHALMIC DILATING DROPS
1.0000 "application " | OPHTHALMIC | Status: DC | PRN
  Administered 2020-07-20 (×3): 1 via OPHTHALMIC

## 2020-07-20 SURGICAL SUPPLY — 24 items
CANNULA ANT/CHMB 27G (MISCELLANEOUS) ×1 IMPLANT
CANNULA ANT/CHMB 27GA (MISCELLANEOUS) ×2 IMPLANT
GLOVE SURG LX 7.5 STRW (GLOVE) ×1
GLOVE SURG LX STRL 7.5 STRW (GLOVE) ×1 IMPLANT
GLOVE SURG TRIUMPH 8.0 PF LTX (GLOVE) ×2 IMPLANT
GOWN STRL REUS W/ TWL LRG LVL3 (GOWN DISPOSABLE) ×2 IMPLANT
GOWN STRL REUS W/TWL LRG LVL3 (GOWN DISPOSABLE) ×4
LENS IOL ACRSF IQ TRC 6 21.5 IMPLANT
LENS IOL ACRYSOF IQ TORIC 21.5 ×2 IMPLANT
LENS IOL IQ TORIC 6 21.5 ×1 IMPLANT
MARKER SKIN DUAL TIP RULER LAB (MISCELLANEOUS) ×2 IMPLANT
NDL CAPSULORHEX 25GA (NEEDLE) ×1 IMPLANT
NDL FILTER BLUNT 18X1 1/2 (NEEDLE) ×2 IMPLANT
NEEDLE CAPSULORHEX 25GA (NEEDLE) ×2 IMPLANT
NEEDLE FILTER BLUNT 18X 1/2SAF (NEEDLE) ×2
NEEDLE FILTER BLUNT 18X1 1/2 (NEEDLE) ×2 IMPLANT
PACK CATARACT BRASINGTON (MISCELLANEOUS) ×2 IMPLANT
PACK EYE AFTER SURG (MISCELLANEOUS) ×2 IMPLANT
PACK OPTHALMIC (MISCELLANEOUS) ×2 IMPLANT
SOLUTION OPHTHALMIC SALT (MISCELLANEOUS) ×2 IMPLANT
SYR 3ML LL SCALE MARK (SYRINGE) ×4 IMPLANT
SYR TB 1ML LUER SLIP (SYRINGE) ×2 IMPLANT
WATER STERILE IRR 250ML POUR (IV SOLUTION) ×2 IMPLANT
WIPE NON LINTING 3.25X3.25 (MISCELLANEOUS) ×2 IMPLANT

## 2020-07-20 NOTE — H&P (Signed)

## 2020-07-20 NOTE — Anesthesia Preprocedure Evaluation (Signed)
Anesthesia Evaluation  Patient identified by MRN, date of birth, ID band Patient awake    Reviewed: Allergy & Precautions, NPO status , Patient's Chart, lab work & pertinent test results  Airway Mallampati: II  TM Distance: >3 FB Neck ROM: Full    Dental no notable dental hx.    Pulmonary neg pulmonary ROS,    Pulmonary exam normal        Cardiovascular hypertension, Normal cardiovascular exam     Neuro/Psych H/o subdural hematoma (2016) negative psych ROS   GI/Hepatic negative GI ROS, Neg liver ROS,   Endo/Other  negative endocrine ROS  Renal/GU S/p nephrectomy 1968     Musculoskeletal negative musculoskeletal ROS (+)   Abdominal Normal abdominal exam  (+)   Peds  Hematology negative hematology ROS (+)   Anesthesia Other Findings   Reproductive/Obstetrics                             Anesthesia Physical Anesthesia Plan  ASA: II  Anesthesia Plan: MAC   Post-op Pain Management:    Induction: Intravenous  PONV Risk Score and Plan: 2 and TIVA, Midazolam and Treatment may vary due to age or medical condition  Airway Management Planned: Natural Airway and Nasal Cannula  Additional Equipment: None  Intra-op Plan:   Post-operative Plan:   Informed Consent: I have reviewed the patients History and Physical, chart, labs and discussed the procedure including the risks, benefits and alternatives for the proposed anesthesia with the patient or authorized representative who has indicated his/her understanding and acceptance.     Dental advisory given  Plan Discussed with: CRNA  Anesthesia Plan Comments:         Anesthesia Quick Evaluation

## 2020-07-20 NOTE — Anesthesia Procedure Notes (Signed)
Date/Time: 07/20/2020 10:23 AM Performed by: Lily Kocher, CRNA Pre-anesthesia Checklist: Patient identified, Emergency Drugs available, Suction available, Patient being monitored and Timeout performed Patient Re-evaluated:Patient Re-evaluated prior to induction Oxygen Delivery Method: Nasal cannula Placement Confirmation: positive ETCO2

## 2020-07-20 NOTE — Transfer of Care (Signed)
Immediate Anesthesia Transfer of Care Note  Patient: Andrea Contreras  Procedure(s) Performed: CATARACT EXTRACTION PHACO AND INTRAOCULAR LENS PLACEMENT (IOC) LEFT TORIC LENS 9.02 01:12.8 12.4% (Left Eye)  Patient Location: PACU  Anesthesia Type: MAC  Level of Consciousness: awake, alert  and patient cooperative  Airway and Oxygen Therapy: Patient Spontanous Breathing and Patient connected to supplemental oxygen  Post-op Assessment: Post-op Vital signs reviewed, Patient's Cardiovascular Status Stable, Respiratory Function Stable, Patent Airway and No signs of Nausea or vomiting  Post-op Vital Signs: Reviewed and stable  Complications: No complications documented.

## 2020-07-20 NOTE — Anesthesia Postprocedure Evaluation (Signed)
Anesthesia Post Note  Patient: Andrea Contreras  Procedure(s) Performed: CATARACT EXTRACTION PHACO AND INTRAOCULAR LENS PLACEMENT (IOC) LEFT TORIC LENS 9.02 01:12.8 12.4% (Left Eye)     Patient location during evaluation: PACU Anesthesia Type: MAC Level of consciousness: awake and alert Pain management: pain level controlled Vital Signs Assessment: post-procedure vital signs reviewed and stable Respiratory status: spontaneous breathing and nonlabored ventilation Cardiovascular status: blood pressure returned to baseline Postop Assessment: no apparent nausea or vomiting Anesthetic complications: no   No complications documented.  Nithya Meriweather [employer]

## 2020-07-20 NOTE — Op Note (Signed)
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   PROCEDURE:  Phacoemulsification with Toric posterior chamber intraocular lens placement of the left eye.  Ultrasound time: Procedure(s) with comments: CATARACT EXTRACTION PHACO AND INTRAOCULAR LENS PLACEMENT (IOC) LEFT TORIC LENS 9.02 01:12.8 12.4% (Left) - Please leave at this arrival LENS:[id number] 21.5 D Toric intraocular lens with 3.75 diopters of cylindrical power with axis orientation at 175 degrees.    SURGEON:  Deirdre Evener, MD   ANESTHESIA:  Topical with tetracaine drops and 2% Xylocaine jelly, augmented with 1% preservative-free intracameral lidocaine.  COMPLICATIONS:  None.   DESCRIPTION OF PROCEDURE:  The patient was identified in the holding room and transported to the operating suite and placed in the supine position under the operating microscope.  The left eye was identified as the operative eye, and it was prepped and draped in the usual sterile ophthalmic fashion.    A clear-corneal paracentesis incision was made at the 1:30 position.  0.5 ml of preservative-free 1% lidocaine was injected into the anterior chamber. The anterior chamber was filled with Viscoat.  A 2.4 millimeter near clear corneal incision was then made at the 10:30 position.  A cystotome and capsulorrhexis forceps were then used to make a curvilinear capsulorrhexis.  Hydrodissection and hydrodelineation were then performed using balanced salt solution.   Phacoemulsification was then used in stop and chop fashion to remove the lens, nucleus and epinucleus.  The remaining cortex was aspirated using the irrigation and aspiration handpiece.  Provisc viscoelastic was then placed into the capsular bag to distend it for lens placement.  The Verion digital marker was used to align the implant at the intended axis.   A 21.5  diopter lens was then injected into the capsular bag.  It was rotated clockwise until the axis marks on the lens were approximately 15 degrees in the counterclockwise direction to the intended alignment.  The viscoelastic was aspirated from the eye using the irrigation aspiration handpiece.  Then, a Koch spatula through the sideport incision was used to rotate the lens in a clockwise direction until the axis markings of the intraocular lens were lined up with the Verion alignment.  Balanced salt solution was then used to hydrate the wounds. Cefuroxime 0.1 ml of a 10mg /ml solution was injected into the anterior chamber for a dose of 1 mg of intracameral antibiotic at the completion of the case.    The eye was noted to have a physiologic pressure and there was no wound leak noted.   Timolol and Brimonidine drops were applied to the eye.  The patient was taken to the recovery room in stable condition having had no complications of anesthesia or surgery.  Austan Nicholl 07/20/2020, 10:43 AM

## 2020-07-21 ENCOUNTER — Encounter: Payer: Self-pay | Admitting: Ophthalmology

## 2020-07-25 DIAGNOSIS — H2511 Age-related nuclear cataract, right eye: Secondary | ICD-10-CM | POA: Diagnosis not present

## 2020-07-27 ENCOUNTER — Encounter: Payer: Self-pay | Admitting: Ophthalmology

## 2020-07-27 ENCOUNTER — Other Ambulatory Visit: Payer: Self-pay

## 2020-08-01 ENCOUNTER — Other Ambulatory Visit
Admission: RE | Admit: 2020-08-01 | Discharge: 2020-08-01 | Disposition: A | Discharge status: Home / Self Care | Payer: Medicare Other | Source: Ambulatory Visit | Attending: Ophthalmology | Admitting: Ophthalmology

## 2020-08-01 ENCOUNTER — Other Ambulatory Visit: Payer: Self-pay

## 2020-08-01 DIAGNOSIS — Z20822 Contact with and (suspected) exposure to covid-19: Secondary | ICD-10-CM | POA: Diagnosis not present

## 2020-08-01 DIAGNOSIS — Z01812 Encounter for preprocedural laboratory examination: Secondary | ICD-10-CM | POA: Diagnosis not present

## 2020-08-01 NOTE — Discharge Instructions (Signed)

## 2020-08-02 LAB — SARS CORONAVIRUS 2 (TAT 6-24 HRS): SARS Coronavirus 2: NEGATIVE

## 2020-08-03 ENCOUNTER — Encounter
Admission: RE | Disposition: A | Discharge status: Home / Self Care | Payer: Self-pay | Source: Home / Self Care | Attending: Ophthalmology

## 2020-08-03 ENCOUNTER — Ambulatory Visit
Admission: RE | Admit: 2020-08-03 | Discharge: 2020-08-03 | Disposition: A | Discharge status: Home / Self Care | Payer: Medicare Other | Attending: Ophthalmology | Admitting: Ophthalmology

## 2020-08-03 ENCOUNTER — Ambulatory Visit: Payer: Medicare Other | Admitting: Anesthesiology

## 2020-08-03 ENCOUNTER — Encounter: Payer: Self-pay | Admitting: Ophthalmology

## 2020-08-03 ENCOUNTER — Other Ambulatory Visit: Payer: Self-pay

## 2020-08-03 DIAGNOSIS — H2511 Age-related nuclear cataract, right eye: Secondary | ICD-10-CM | POA: Insufficient documentation

## 2020-08-03 DIAGNOSIS — Z905 Acquired absence of kidney: Secondary | ICD-10-CM | POA: Insufficient documentation

## 2020-08-03 DIAGNOSIS — Z9049 Acquired absence of other specified parts of digestive tract: Secondary | ICD-10-CM | POA: Diagnosis not present

## 2020-08-03 DIAGNOSIS — Z79899 Other long term (current) drug therapy: Secondary | ICD-10-CM | POA: Diagnosis not present

## 2020-08-03 DIAGNOSIS — H25811 Combined forms of age-related cataract, right eye: Secondary | ICD-10-CM | POA: Diagnosis not present

## 2020-08-03 HISTORY — PX: CATARACT EXTRACTION W/PHACO: SHX586

## 2020-08-03 SURGERY — PHACOEMULSIFICATION, CATARACT, WITH IOL INSERTION
Anesthesia: Monitor Anesthesia Care | Site: Eye | Laterality: Right

## 2020-08-03 MED ORDER — ACETAMINOPHEN 160 MG/5ML PO SOLN: 325.0000 mg | ORAL | Status: DC | PRN

## 2020-08-03 MED ORDER — MIDAZOLAM HCL 2 MG/2ML IJ SOLN
INTRAMUSCULAR | Status: DC | PRN
  Administered 2020-08-03: 1 mg via INTRAVENOUS

## 2020-08-03 MED ORDER — NA HYALUR & NA CHOND-NA HYALUR 0.4-0.35 ML IO KIT
PACK | INTRAOCULAR | Status: DC | PRN
  Administered 2020-08-03: 1 mL via INTRAOCULAR

## 2020-08-03 MED ORDER — LIDOCAINE HCL (PF) 2 % IJ SOLN
INTRAOCULAR | Status: DC | PRN
  Administered 2020-08-03: 1 mL

## 2020-08-03 MED ORDER — EPINEPHRINE PF 1 MG/ML IJ SOLN
INTRAOCULAR | Status: DC | PRN
  Administered 2020-08-03: 69 mL via OPHTHALMIC

## 2020-08-03 MED ORDER — CYCLOPENTOLATE HCL 2 % OP SOLN
1.0000 [drp] | OPHTHALMIC | Status: DC | PRN
  Administered 2020-08-03 (×3): 1 [drp] via OPHTHALMIC

## 2020-08-03 MED ORDER — ACETAMINOPHEN 325 MG PO TABS: 325.0000 mg | ORAL_TABLET | ORAL | Status: DC | PRN

## 2020-08-03 MED ORDER — BRIMONIDINE TARTRATE-TIMOLOL 0.2-0.5 % OP SOLN
OPHTHALMIC | Status: DC | PRN
  Administered 2020-08-03: 1 [drp] via OPHTHALMIC

## 2020-08-03 MED ORDER — TETRACAINE HCL 0.5 % OP SOLN
1.0000 [drp] | OPHTHALMIC | Status: DC | PRN
  Administered 2020-08-03 (×3): 1 [drp] via OPHTHALMIC

## 2020-08-03 MED ORDER — FENTANYL CITRATE (PF) 100 MCG/2ML IJ SOLN
INTRAMUSCULAR | Status: DC | PRN
  Administered 2020-08-03: 50 ug via INTRAVENOUS

## 2020-08-03 MED ORDER — LACTATED RINGERS IV SOLN: INTRAVENOUS | Status: DC

## 2020-08-03 MED ORDER — CEFUROXIME OPHTHALMIC INJECTION 1 MG/0.1 ML
INJECTION | OPHTHALMIC | Status: DC | PRN
  Administered 2020-08-03: 0.1 mL via INTRACAMERAL

## 2020-08-03 MED ORDER — PHENYLEPHRINE HCL 10 % OP SOLN
1.0000 [drp] | OPHTHALMIC | Status: DC | PRN
  Administered 2020-08-03 (×3): 1 [drp] via OPHTHALMIC

## 2020-08-03 SURGICAL SUPPLY — 30 items
CANNULA ANT/CHMB 27G (MISCELLANEOUS) ×1 IMPLANT
CANNULA ANT/CHMB 27GA (MISCELLANEOUS) ×2 IMPLANT
GLOVE SURG LX 7.5 STRW (GLOVE) ×2
GLOVE SURG LX STRL 7.5 STRW (GLOVE) ×1 IMPLANT
GLOVE SURG TRIUMPH 8.0 PF LTX (GLOVE) ×2 IMPLANT
GOWN STRL REUS W/ TWL LRG LVL3 (GOWN DISPOSABLE) ×2 IMPLANT
GOWN STRL REUS W/TWL LRG LVL3 (GOWN DISPOSABLE) ×4
LENS IOL ACRSF IQ TRC 5 21.5 IMPLANT
LENS IOL ACRYSOF IQ TORIC 21.5 ×2 IMPLANT
LENS IOL IQ TORIC 5 21.5 ×1 IMPLANT
MARKER SKIN DUAL TIP RULER LAB (MISCELLANEOUS) ×2 IMPLANT
NDL CAPSULORHEX 25GA (NEEDLE) ×1 IMPLANT
NDL FILTER BLUNT 18X1 1/2 (NEEDLE) ×2 IMPLANT
NDL RETROBULBAR .5 NSTRL (NEEDLE) IMPLANT
NEEDLE CAPSULORHEX 25GA (NEEDLE) ×2 IMPLANT
NEEDLE FILTER BLUNT 18X 1/2SAF (NEEDLE) ×2
NEEDLE FILTER BLUNT 18X1 1/2 (NEEDLE) ×2 IMPLANT
PACK CATARACT BRASINGTON (MISCELLANEOUS) ×2 IMPLANT
PACK EYE AFTER SURG (MISCELLANEOUS) ×2 IMPLANT
PACK OPTHALMIC (MISCELLANEOUS) ×2 IMPLANT
RING MALYGIN 7.0 (MISCELLANEOUS) IMPLANT
SOLUTION OPHTHALMIC SALT (MISCELLANEOUS) ×2 IMPLANT
SUT ETHILON 10-0 CS-B-6CS-B-6 (SUTURE)
SUT VICRYL  9 0 (SUTURE)
SUT VICRYL 9 0 (SUTURE) IMPLANT
SUTURE EHLN 10-0 CS-B-6CS-B-6 (SUTURE) IMPLANT
SYR 3ML LL SCALE MARK (SYRINGE) ×4 IMPLANT
SYR TB 1ML LUER SLIP (SYRINGE) ×2 IMPLANT
WATER STERILE IRR 250ML POUR (IV SOLUTION) ×2 IMPLANT
WIPE NON LINTING 3.25X3.25 (MISCELLANEOUS) ×2 IMPLANT

## 2020-08-03 NOTE — Transfer of Care (Signed)
Immediate Anesthesia Transfer of Care Note  Patient: Andrea Contreras  Procedure(s) Performed: CATARACT EXTRACTION PHACO AND INTRAOCULAR LENS PLACEMENT (IOC) RIGHT TORIC LENS (Right Eye)  Patient Location: PACU  Anesthesia Type: MAC  Level of Consciousness: awake, alert  and patient cooperative  Airway and Oxygen Therapy: Patient Spontanous Breathing and Patient connected to supplemental oxygen  Post-op Assessment: Post-op Vital signs reviewed, Patient's Cardiovascular Status Stable, Respiratory Function Stable, Patent Airway and No signs of Nausea or vomiting  Post-op Vital Signs: Reviewed and stable  Complications: No complications documented.

## 2020-08-03 NOTE — Anesthesia Postprocedure Evaluation (Signed)
Anesthesia Post Note  Patient: Andrea Contreras  Procedure(s) Performed: CATARACT EXTRACTION PHACO AND INTRAOCULAR LENS PLACEMENT (IOC) RIGHT TORIC LENS (Right Eye)     Patient location during evaluation: PACU Anesthesia Type: MAC Level of consciousness: awake and alert Pain management: pain level controlled Vital Signs Assessment: post-procedure vital signs reviewed and stable Respiratory status: spontaneous breathing, nonlabored ventilation, respiratory function stable and patient connected to nasal cannula oxygen Cardiovascular status: stable and blood pressure returned to baseline Postop Assessment: no apparent nausea or vomiting Anesthetic complications: no   No complications documented.  Trecia Rogers

## 2020-08-03 NOTE — Anesthesia Preprocedure Evaluation (Signed)
Anesthesia Evaluation  Patient identified by MRN, date of birth, ID band Patient awake    Reviewed: Allergy & Precautions, H&P , NPO status , Patient's Chart, lab work & pertinent test results, reviewed documented beta blocker date and time   Airway Mallampati: II  TM Distance: >3 FB Neck ROM: full    Dental no notable dental hx.    Pulmonary neg pulmonary ROS,    Pulmonary exam normal breath sounds clear to auscultation       Cardiovascular Exercise Tolerance: Good hypertension, Normal cardiovascular exam Rhythm:regular Rate:Normal     Neuro/Psych negative neurological ROS  negative psych ROS   GI/Hepatic negative GI ROS, Neg liver ROS,   Endo/Other  negative endocrine ROS  Renal/GU negative Renal ROS  negative genitourinary   Musculoskeletal   Abdominal   Peds  Hematology negative hematology ROS (+)   Anesthesia Other Findings   Reproductive/Obstetrics negative OB ROS                             Anesthesia Physical Anesthesia Plan  ASA: II  Anesthesia Plan: MAC   Post-op Pain Management:    Induction:   PONV Risk Score and Plan:   Airway Management Planned:   Additional Equipment:   Intra-op Plan:   Post-operative Plan:   Informed Consent: I have reviewed the patients History and Physical, chart, labs and discussed the procedure including the risks, benefits and alternatives for the proposed anesthesia with the patient or authorized representative who has indicated his/her understanding and acceptance.     Dental Advisory Given  Plan Discussed with: CRNA and Anesthesiologist  Anesthesia Plan Comments:         Anesthesia Quick Evaluation  

## 2020-08-03 NOTE — H&P (Signed)

## 2020-08-03 NOTE — Op Note (Signed)
  LOCATION:  [facility]   PREOPERATIVE DIAGNOSIS:  Nuclear sclerotic cataract of the right eye.  H25.11   POSTOPERATIVE DIAGNOSIS:  Nuclear sclerotic cataract of the right eye.   PROCEDURE:  Phacoemulsification with Toric posterior chamber intraocular lens placement of the right eye.  Ultrasound time: Procedure(s) with comments: CATARACT EXTRACTION PHACO AND INTRAOCULAR LENS PLACEMENT (IOC) RIGHT TORIC LENS (Right) - 9.38 1:10.3 13.4%  LENS:   Implant Name Type Inv. Item Serial No. Manufacturer Lot No. LRB No. Used Action  LENS IOL ACRYSOF IQ TORIC 21.5 - [id number]  LENS IOL ACRYSOF IQ TORIC 21.5 [id number] ALCON  Right 1 Implanted     [id number] 21.5D Toric intraocular lens with 3.0 diopters of cylindrical power with axis orientation at 9 degrees.    SURGEON:  Deirdre Evener, MD   ANESTHESIA: Topical with tetracaine drops and 2% Xylocaine jelly, augmented with 1% preservative-free intracameral lidocaine. .   COMPLICATIONS:  None.   DESCRIPTION OF PROCEDURE:  The patient was identified in the holding room and transported to the operating suite and placed in the supine position under the operating microscope.  The right eye was identified as the operative eye, and it was prepped and draped in the usual sterile ophthalmic fashion.    A clear-corneal paracentesis incision was made at the 12:00 position.  0.5 ml of preservative-free 1% lidocaine was injected into the anterior chamber. The anterior chamber was filled with Viscoat.  A 2.4 millimeter near clear corneal incision was then made at the 9:00 position.  A cystotome and capsulorrhexis forceps were then used to make a curvilinear capsulorrhexis.  Hydrodissection and hydrodelineation were then performed using balanced salt solution.   Phacoemulsification was then used in stop and chop fashion to remove the lens, nucleus and epinucleus.  The remaining cortex was aspirated using the irrigation and aspiration handpiece.   Provisc viscoelastic was then placed into the capsular bag to distend it for lens placement.  The Verion digital marker was used to align the implant at the intended axis.   A Toric lens was then injected into the capsular bag.  It was rotated clockwise until the axis marks on the lens were approximately 15 degrees in the counterclockwise direction to the intended alignment.  The viscoelastic was aspirated from the eye using the irrigation aspiration handpiece.  Then, a Koch spatula through the sideport incision was used to rotate the lens in a clockwise direction until the axis markings of the intraocular lens were lined up with the Verion alignment.  Balanced salt solution was then used to hydrate the wounds. Cefuroxime 0.1 ml of a 10mg /ml solution was injected into the anterior chamber for a dose of 1 mg of intracameral antibiotic at the completion of the case.    The eye was noted to have a physiologic pressure and there was no wound leak noted.   Timolol and Brimonidine drops were applied to the eye.  The patient was taken to the recovery room in stable condition having had no complications of anesthesia or surgery.  Andrea Contreras 08/03/2020, 10:38 AM

## 2020-08-03 NOTE — Anesthesia Procedure Notes (Signed)
Procedure Name: MAC Performed by: Trayson Stitely, CRNA Pre-anesthesia Checklist: Patient identified, Emergency Drugs available, Suction available, Timeout performed and Patient being monitored Patient Re-evaluated:Patient Re-evaluated prior to induction Oxygen Delivery Method: Nasal cannula Placement Confirmation: positive ETCO2       

## 2020-08-05 ENCOUNTER — Encounter: Payer: Self-pay | Admitting: Ophthalmology

## 2020-09-19 DIAGNOSIS — H269 Unspecified cataract: Secondary | ICD-10-CM | POA: Diagnosis not present

## 2020-09-19 DIAGNOSIS — M542 Cervicalgia: Secondary | ICD-10-CM | POA: Diagnosis not present

## 2020-09-19 DIAGNOSIS — Z905 Acquired absence of kidney: Secondary | ICD-10-CM | POA: Diagnosis not present

## 2020-09-19 DIAGNOSIS — I10 Essential (primary) hypertension: Secondary | ICD-10-CM | POA: Diagnosis not present

## 2021-03-15 DIAGNOSIS — I10 Essential (primary) hypertension: Secondary | ICD-10-CM | POA: Diagnosis not present

## 2021-03-15 DIAGNOSIS — M542 Cervicalgia: Secondary | ICD-10-CM | POA: Diagnosis not present

## 2021-04-17 DIAGNOSIS — H40003 Preglaucoma, unspecified, bilateral: Secondary | ICD-10-CM | POA: Diagnosis not present

## 2021-05-10 DIAGNOSIS — Z0000 Encounter for general adult medical examination without abnormal findings: Secondary | ICD-10-CM | POA: Diagnosis not present

## 2021-05-10 DIAGNOSIS — I8390 Asymptomatic varicose veins of unspecified lower extremity: Secondary | ICD-10-CM | POA: Diagnosis not present

## 2021-05-10 DIAGNOSIS — I10 Essential (primary) hypertension: Secondary | ICD-10-CM | POA: Diagnosis not present

## 2021-05-10 DIAGNOSIS — M542 Cervicalgia: Secondary | ICD-10-CM | POA: Diagnosis not present

## 2021-05-10 DIAGNOSIS — Z905 Acquired absence of kidney: Secondary | ICD-10-CM | POA: Diagnosis not present

## 2021-10-28 ENCOUNTER — Encounter: Payer: Self-pay | Admitting: Emergency Medicine

## 2021-10-28 ENCOUNTER — Emergency Department
Admission: EM | Admit: 2021-10-28 | Discharge: 2021-10-28 | Disposition: A | Discharge status: Home / Self Care | Payer: Medicare Other | Attending: Emergency Medicine | Admitting: Emergency Medicine

## 2021-10-28 ENCOUNTER — Other Ambulatory Visit: Payer: Self-pay

## 2021-10-28 DIAGNOSIS — R944 Abnormal results of kidney function studies: Secondary | ICD-10-CM | POA: Insufficient documentation

## 2021-10-28 DIAGNOSIS — F439 Reaction to severe stress, unspecified: Secondary | ICD-10-CM | POA: Diagnosis not present

## 2021-10-28 DIAGNOSIS — I10 Essential (primary) hypertension: Secondary | ICD-10-CM | POA: Diagnosis not present

## 2021-10-28 LAB — CBC
HCT: 41.5 % (ref 36.0–46.0)
Hemoglobin: 13.8 g/dL (ref 12.0–15.0)
MCH: 31.4 pg (ref 26.0–34.0)
MCHC: 33.3 g/dL (ref 30.0–36.0)
MCV: 94.3 fL (ref 80.0–100.0)
Platelets: 191 10*3/uL (ref 150–400)
RBC: 4.4 MIL/uL (ref 3.87–5.11)
RDW: 13.9 % (ref 11.5–15.5)
WBC: 3.7 10*3/uL — ABNORMAL LOW (ref 4.0–10.5)
nRBC: 0 % (ref 0.0–0.2)

## 2021-10-28 LAB — COMPREHENSIVE METABOLIC PANEL
ALT: 14 U/L (ref 0–44)
AST: 19 U/L (ref 15–41)
Albumin: 4.1 g/dL (ref 3.5–5.0)
Alkaline Phosphatase: 90 U/L (ref 38–126)
Anion gap: 6 (ref 5–15)
BUN: 21 mg/dL (ref 8–23)
CO2: 25 mmol/L (ref 22–32)
Calcium: 9.3 mg/dL (ref 8.9–10.3)
Chloride: 103 mmol/L (ref 98–111)
Creatinine, Ser: 1.06 mg/dL — ABNORMAL HIGH (ref 0.44–1.00)
GFR, Estimated: 52 mL/min — ABNORMAL LOW (ref >60)
Glucose, Bld: 109 mg/dL — ABNORMAL HIGH (ref 70–99)
Potassium: 4.4 mmol/L (ref 3.5–5.1)
Sodium: 134 mmol/L — ABNORMAL LOW (ref 135–145)
Total Bilirubin: 0.9 mg/dL (ref 0.3–1.2)
Total Protein: 7.1 g/dL (ref 6.5–8.1)

## 2021-10-28 LAB — TROPONIN I (HIGH SENSITIVITY): Troponin I (High Sensitivity): 5 ng/L (ref <18)

## 2021-10-28 NOTE — ED Notes (Signed)
Lab called to add troponin 

## 2021-10-28 NOTE — Discharge Instructions (Signed)
Please seek medical attention for any high fevers, chest pain, shortness of breath, change in behavior, persistent vomiting, bloody stool or any other new or concerning symptoms.  

## 2021-10-28 NOTE — ED Triage Notes (Signed)
Pt in via [facility] from home with c/o HTN  Pt took her BP on her home cuff and it read 210/100. Pt called her MD who advised her to come to the ED. EMS reports 178/92. Pt with no symptoms other than a slight pressure in her temples.   HR 96, 97% RA. Pt take atenolol for HTN. Pt PMD is Dr. Ola Spurr.

## 2021-10-28 NOTE — ED Triage Notes (Signed)
Pt presents to there ER from home via [facility]. Pt reports she took BP at home and was elevated 210/100. Pt reports she took Atenelol1/2 a pill and BP continues to be high per her MD recommendation to come to ER. Pt reports slight pressure in her temples.

## 2021-10-28 NOTE — ED Provider Notes (Signed)
[facility] Provider Note    Event Date/Time   First MD Initiated Contact with Patient 10/28/21 2202     (approximate)   History   Hypertension   HPI  Andrea Contreras is a 81 y.o. female  who, per PCP note dated 05/10/21 has history of HTN, who presents to the emergency department today because of concerns for elevated blood pressure.  Patient states she had been out in her garden earlier today and got hot.  When she came inside she checked her blood pressure and it was elevated.  She then checked it a few more times and it continued to stay elevated.  Patient denies any chest pain or palpitations.  She says that she is under quite a bit of stress secondary to husband's health problems.  Physical Exam   Triage Vital Signs: ED Triage Vitals  Enc Vitals Group     BP 10/28/21 1905 (!) 168/94     Pulse Rate 10/28/21 1905 63     Resp 10/28/21 1905 16     Temp 10/28/21 1905 98 F (36.7 C)     Temp Source 10/28/21 1905 Oral     SpO2 10/28/21 1905 93 %     Weight 10/28/21 1906 132 lb (59.9 kg)     Height 10/28/21 1906 5\' 7"  (1.702 m)     Head Circumference --      Peak Flow --      Pain Score 10/28/21 1906 0   Most recent vital signs: Vitals:   10/28/21 2132 10/28/21 2139  BP: (!) 195/82 (!) 195/82  Pulse: 65 (!) 59  Resp: 18 19  Temp:    SpO2: 99% 99%    General: Awake, alert and oriented. CV:  Good peripheral perfusion. Regular rate and rhythm.  Resp:  Normal effort. Lungs clear. Abd:  No distention.     ED Results / Procedures / Treatments   Labs (all labs ordered are listed, but only abnormal results are displayed) Labs Reviewed  CBC - Abnormal; Notable for the following components:      Result Value   WBC 3.7 (*)    All other components within normal limits  COMPREHENSIVE METABOLIC PANEL - Abnormal; Notable for the following components:   Sodium 134 (*)    Glucose, Bld 109 (*)    Creatinine, Ser 1.06 (*)    GFR, Estimated 52 (*)     All other components within normal limits  TROPONIN I (HIGH SENSITIVITY)  TROPONIN I (HIGH SENSITIVITY)     EKG  I, Nance Pear, attending physician, personally viewed and interpreted this EKG  EKG Time: 1907 Rate: 65 Rhythm: sinus rhythm  Axis: normal Intervals: qtc 395 QRS: narrow ST changes: no st elevation Impression: normal ekg    RADIOLOGY None   PROCEDURES:  Critical Care performed: No  Procedures   MEDICATIONS ORDERED IN ED: Medications - No data to display   IMPRESSION / MDM / [place] / ED COURSE  I reviewed the triage vital signs and the nursing notes.                              Differential diagnosis includes, but is not limited to, hypertension, heat exposure.   Patient's presentation is most consistent with acute presentation with potential threat to life or bodily function.  Patient presented to the emergency department today because of concerns for elevated blood pressure that was  checked when the patient got hot after working outside.  The time my exam patient is awake alert and oriented.  No concerning auscultatory findings.  Troponin was negative.  Very minimal creatinine elevation.  At this time I have low suspicion for acute endorgan damage.  I did have a discussion with the patient about elevated blood pressure.  Did encourage patient to follow-up with her primary care. FINAL CLINICAL IMPRESSION(S) / ED DIAGNOSES   Final diagnoses:  Hypertension, unspecified type      Note:  This document was prepared using Dragon voice recognition software and may include unintentional dictation errors.    Nance Pear, MD 10/28/21 [phone]

## 2021-10-28 NOTE — ED Notes (Signed)
Pt brought back from lobby to room 1. Pts current BP 195/82. States compliance with medication. No recent change. Denies headache/NV/Vision changes.

## 2022-10-23 ENCOUNTER — Encounter: Payer: Self-pay | Admitting: *Deleted

## 2022-10-26 ENCOUNTER — Encounter: Payer: Self-pay | Admitting: *Deleted

## 2022-10-29 ENCOUNTER — Encounter: Payer: Self-pay | Admitting: *Deleted

## 2022-10-29 ENCOUNTER — Ambulatory Visit: Payer: Medicare Other | Admitting: Anesthesiology

## 2022-10-29 ENCOUNTER — Ambulatory Visit
Admission: RE | Admit: 2022-10-29 | Discharge: 2022-10-29 | Disposition: A | Discharge status: Home / Self Care | Payer: Medicare Other | Attending: Gastroenterology | Admitting: Gastroenterology

## 2022-10-29 ENCOUNTER — Other Ambulatory Visit: Payer: Self-pay

## 2022-10-29 ENCOUNTER — Encounter
Admission: RE | Disposition: A | Discharge status: Home / Self Care | Payer: Self-pay | Source: Home / Self Care | Attending: Gastroenterology

## 2022-10-29 DIAGNOSIS — I10 Essential (primary) hypertension: Secondary | ICD-10-CM | POA: Insufficient documentation

## 2022-10-29 DIAGNOSIS — D125 Benign neoplasm of sigmoid colon: Secondary | ICD-10-CM | POA: Insufficient documentation

## 2022-10-29 DIAGNOSIS — K625 Hemorrhage of anus and rectum: Secondary | ICD-10-CM | POA: Insufficient documentation

## 2022-10-29 DIAGNOSIS — K5730 Diverticulosis of large intestine without perforation or abscess without bleeding: Secondary | ICD-10-CM | POA: Diagnosis not present

## 2022-10-29 DIAGNOSIS — K641 Second degree hemorrhoids: Secondary | ICD-10-CM | POA: Insufficient documentation

## 2022-10-29 HISTORY — PX: COLONOSCOPY WITH PROPOFOL: SHX5780

## 2022-10-29 SURGERY — COLONOSCOPY WITH PROPOFOL
Anesthesia: General

## 2022-10-29 MED ORDER — EPINEPHRINE 1 MG/10ML IJ SOSY
PREFILLED_SYRINGE | INTRAMUSCULAR | Status: AC
  Filled 2022-10-29: qty 10

## 2022-10-29 MED ORDER — SODIUM CHLORIDE (PF) 0.9 % IJ SOLN
PREFILLED_SYRINGE | INTRAMUSCULAR | Status: DC | PRN
  Administered 2022-10-29: 3 mL

## 2022-10-29 MED ORDER — PROPOFOL 10 MG/ML IV BOLUS
INTRAVENOUS | Status: DC | PRN
  Administered 2022-10-29: 50 mg via INTRAVENOUS
  Administered 2022-10-29: 20 mg via INTRAVENOUS

## 2022-10-29 MED ORDER — LIDOCAINE HCL (PF) 2 % IJ SOLN
INTRAMUSCULAR | Status: AC
  Filled 2022-10-29: qty 5

## 2022-10-29 MED ORDER — PROPOFOL 500 MG/50ML IV EMUL
INTRAVENOUS | Status: DC | PRN
  Administered 2022-10-29: 100 ug/kg/min via INTRAVENOUS

## 2022-10-29 MED ORDER — PROPOFOL 1000 MG/100ML IV EMUL
INTRAVENOUS | Status: AC
  Filled 2022-10-29: qty 100

## 2022-10-29 MED ORDER — SODIUM CHLORIDE 0.9 % IV SOLN: INTRAVENOUS | Status: DC

## 2022-10-29 MED ORDER — LIDOCAINE 2% (20 MG/ML) 5 ML SYRINGE
INTRAMUSCULAR | Status: DC | PRN
  Administered 2022-10-29: 20 mg via INTRAVENOUS

## 2022-10-29 MED ORDER — GLYCOPYRROLATE 0.2 MG/ML IJ SOLN
INTRAMUSCULAR | Status: AC
  Filled 2022-10-29: qty 1

## 2022-10-29 NOTE — Transfer of Care (Signed)
Immediate Anesthesia Transfer of Care Note  Patient: Andrea Contreras  Procedure(s) Performed: COLONOSCOPY WITH PROPOFOL  Patient Location: Endoscopy Unit  Anesthesia Type:General  Level of Consciousness: drowsy  Airway & Oxygen Therapy: Patient Spontanous Breathing  Post-op Assessment: Report given to RN and Post -op Vital signs reviewed and stable  Post vital signs: Reviewed  Last Vitals:  Vitals Value Taken Time  BP 102/53 10/29/22 1322  Temp 35.6 C 10/29/22 1322  Pulse 71 10/29/22 1322  Resp 14 10/29/22 1322  SpO2 99 % 10/29/22 1322  Vitals shown include unvalidated device data.  Last Pain:  Vitals:   10/29/22 1322  TempSrc: Temporal  PainSc: Asleep         Complications: No notable events documented.

## 2022-10-29 NOTE — Anesthesia Preprocedure Evaluation (Addendum)
Anesthesia Evaluation  Patient identified by MRN, date of birth, ID band Patient awake    Reviewed: Allergy & Precautions, H&P , NPO status , Patient's Chart, lab work & pertinent test results  Airway Mallampati: III  TM Distance: >3 FB Neck ROM: full    Dental  (+) Caps   Pulmonary neg pulmonary ROS   Pulmonary exam normal        Cardiovascular hypertension, Pt. on medications Normal cardiovascular exam     Neuro/Psych negative neurological ROS  negative psych ROS   GI/Hepatic negative GI ROS, Neg liver ROS,,,  Endo/Other  negative endocrine ROS    Renal/GU negative Renal ROS  negative genitourinary   Musculoskeletal   Abdominal Normal abdominal exam  (+)   Peds  Hematology negative hematology ROS (+)   Anesthesia Other Findings Past Medical History: 2006: History of shingles No date: Hypertension  Past Surgical History: No date: APPENDECTOMY 07/20/2020: CATARACT EXTRACTION W/PHACO; Left     Comment:  Procedure: CATARACT EXTRACTION PHACO AND INTRAOCULAR               LENS PLACEMENT (IOC) LEFT TORIC LENS 9.02 01:12.8 12.4%;               Surgeon: Lockie Mola, MD;  Location: [facility];  Service: Ophthalmology;  Laterality: Left;              Please leave at this arrival 08/03/2020: CATARACT EXTRACTION W/PHACO; Right     Comment:  Procedure: CATARACT EXTRACTION PHACO AND INTRAOCULAR               LENS PLACEMENT (IOC) RIGHT TORIC LENS;  Surgeon:               Lockie Mola, MD;  Location: [facility];              Service: Ophthalmology;  Laterality: Right;                9.38 1:10.3 13.4% No date: FOOT SURGERY No date: KNEE ARTHROSCOPY; 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; Left No date: NOSE SURGERY     Reproductive/Obstetrics negative OB ROS                             Anesthesia Physical Anesthesia Plan  ASA: 2  Anesthesia Plan: General    Post-op Pain Management:    Induction: Intravenous  PONV Risk Score and Plan: Propofol infusion and TIVA  Airway Management Planned: Natural Airway  Additional Equipment:   Intra-op Plan:   Post-operative Plan:   Informed Consent: I have reviewed the patients History and Physical, chart, labs and discussed the procedure including the risks, benefits and alternatives for the proposed anesthesia with the patient or authorized representative who has indicated his/her understanding and acceptance.     Dental Advisory Given  Plan Discussed with: CRNA and Surgeon  Anesthesia Plan Comments:         Anesthesia Quick Evaluation

## 2022-10-29 NOTE — H&P (Signed)
Outpatient short stay form Pre-procedure 10/29/2022  Regis Bill, MD  Primary Physician: Mick Sell, MD  Reason for visit:  Rectal Bleeding  History of present illness:    82 y/o lady with history of hypertension here for colonoscopy due to rectal bleeding. Last colonoscopy was in 2003 and was reportedly normal. No blood thinners. No family history of GI malignancies. No significant abdominal surgeries.    Current Facility-Administered Medications:    0.9 %  sodium chloride infusion, , Intravenous, Continuous, Lavel Rieman, Rossie Muskrat, MD, Last Rate: 20 mL/hr at 10/29/22 1225, New Bag at 10/29/22 1225  Medications Prior to Admission  Medication Sig Dispense Refill Last Dose   atenolol (TENORMIN) 25 MG tablet Take 25 mg by mouth daily.   10/28/2022   acetaminophen (TYLENOL) 500 MG tablet Take 1,000 mg by mouth daily as needed for moderate pain.      amLODipine (NORVASC) 10 MG tablet Take 10 mg by mouth daily.      ibuprofen (ADVIL,MOTRIN) 200 MG tablet Take 200 mg by mouth every 6 (six) hours as needed.      Polyvinyl Alcohol-Povidone (MURINE TEARS FOR DRY EYES OP) Place 1 drop into both eyes daily.      PRESCRIPTION MEDICATION Apply 1 application topically daily as needed (itching and rash).      valACYclovir (VALTREX) 500 MG tablet Take 500 mg by mouth 2 (two) times daily.        No Known Allergies   Past Medical History:  Diagnosis Date   History of shingles 2006   Hypertension     Review of systems:  Otherwise negative.    Physical Exam  Gen: Alert, oriented. Appears stated age.  HEENT: PERRLA. Lungs: No respiratory distress CV: RRR Abd: soft, benign, no masses Ext: No edema    Planned procedures: Proceed with colonoscopy. The patient understands the nature of the planned procedure, indications, risks, alternatives and potential complications including but not limited to bleeding, infection, perforation, damage to internal organs and possible  oversedation/side effects from anesthesia. The patient agrees and gives consent to proceed.  Please refer to procedure notes for findings, recommendations and patient disposition/instructions.     Regis Bill, MD [facility]

## 2022-10-29 NOTE — Op Note (Signed)
[facility] Gastroenterology Patient Name: Andrea Contreras Procedure Date: 10/29/2022 12:46 PM MRN: [id number] Account #: [id number] Date of Birth: [birth year] Admit Type: Outpatient Age: 82 Room: [facility] ENDO ROOM 3 Gender: Female Note Status: Finalized Instrument Name: Peds Colonoscope [id number] Procedure:             Colonoscopy Indications:           Rectal bleeding Providers:             Eather Colas MD, MD Referring MD:          Eather Colas MD, MD (Referring MD), Clydie Braun (Referring MD) Medicines:             Monitored Anesthesia Care Complications:         No immediate complications. Estimated blood loss:                         Minimal. Procedure:             Pre-Anesthesia Assessment:                        - Prior to the procedure, a History and Physical was                         performed, and patient medications and allergies were                         reviewed. The patient is competent. The risks and                         benefits of the procedure and the sedation options and                         risks were discussed with the patient. All questions                         were answered and informed consent was obtained.                         Patient identification and proposed procedure were                         verified by the physician, the nurse, the                         anesthesiologist, the anesthetist and the technician                         in the endoscopy suite. Mental Status Examination:                         alert and oriented. Airway Examination: normal                         oropharyngeal airway and neck mobility. Respiratory  Examination: clear to auscultation. CV Examination:                         normal. Prophylactic Antibiotics: The patient does not                         require prophylactic antibiotics. Prior                         Anticoagulants: The  patient has taken no anticoagulant                         or antiplatelet agents. ASA Grade Assessment: II - A                         patient with mild systemic disease. After reviewing                         the risks and benefits, the patient was deemed in                         satisfactory condition to undergo the procedure. The                         anesthesia plan was to use monitored anesthesia care                         (MAC). Immediately prior to administration of                         medications, the patient was re-assessed for adequacy                         to receive sedatives. The heart rate, respiratory                         rate, oxygen saturations, blood pressure, adequacy of                         pulmonary ventilation, and response to care were                         monitored throughout the procedure. The physical                         status of the patient was re-assessed after the                         procedure.                        After obtaining informed consent, the colonoscope was                         passed under direct vision. Throughout the procedure,                         the patient's blood pressure, pulse, and oxygen  saturations were monitored continuously. The                         Colonoscope was introduced through the anus and                         advanced to the the terminal ileum. The colonoscopy                         was performed without difficulty. The patient                         tolerated the procedure well. The quality of the bowel                         preparation was good. The ileocecal valve, appendiceal                         orifice, and rectum were photographed. Findings:      The perianal and digital rectal examinations were normal.      Multiple small-mouthed diverticula were found in the sigmoid colon and       descending colon.      A 15 mm polyp was found in the sigmoid  colon. The polyp was       pedunculated. The polyp was removed with a hot snare. Resection and       retrieval were complete. To prevent bleeding post-intervention, one       hemostatic clip was successfully placed. There was no bleeding during,       or at the end, of the procedure. Area was successfully injected with 2       mL of a 0.1 mg/mL solution of epinephrine for prevention of bleeding.       Estimated blood loss: none.      Internal hemorrhoids were found during retroflexion. The hemorrhoids       were Grade II (internal hemorrhoids that prolapse but reduce       spontaneously).      The exam was otherwise without abnormality on direct and retroflexion       views. Impression:            - Diverticulosis in the sigmoid colon and in the                         descending colon.                        - One 15 mm polyp in the sigmoid colon, removed with a                         hot snare. Resected and retrieved. Clip was placed.                         Injected.                        - Internal hemorrhoids.                        - The examination was otherwise normal on direct and  retroflexion views. Recommendation:        - Discharge patient to home.                        - Resume previous diet.                        - Continue present medications.                        - Await pathology results.                        - Repeat colonoscopy is not recommended due to current                         age (89 years or older) for surveillance.                        - Return to referring physician as previously                         scheduled. Procedure Code(s):     --- Professional ---                        [phone], Colonoscopy, flexible; with removal of                         tumor(s), polyp(s), or other lesion(s) by snare                         technique Diagnosis Code(s):     --- Professional ---                        K64.1, Second degree  hemorrhoids                        D12.5, Benign neoplasm of sigmoid colon                        K62.5, Hemorrhage of anus and rectum                        K57.30, Diverticulosis of large intestine without                         perforation or abscess without bleeding CPT copyright 2022 [employer]. All rights reserved. The codes documented in this report are preliminary and upon coder review may  be revised to meet current compliance requirements. Eather Colas MD, MD 10/29/2022 1:23:31 PM Number of Addenda: 0 Note Initiated On: 10/29/2022 12:46 PM Scope Withdrawal Time: 0 hours 16 minutes 6 seconds  Total Procedure Duration: 0 hours 23 minutes 1 second  Estimated Blood Loss:  Estimated blood loss was minimal.      [facility]

## 2022-10-29 NOTE — Interval H&P Note (Signed)
History and Physical Interval Note:  10/29/2022 12:34 PM  Andrea Contreras  has presented today for surgery, with the diagnosis of rectrqal bleeding.  The various methods of treatment have been discussed with the patient and family. After consideration of risks, benefits and other options for treatment, the patient has consented to  Procedure(s): COLONOSCOPY WITH PROPOFOL (N/A) as a surgical intervention.  The patient's history has been reviewed, patient examined, no change in status, stable for surgery.  I have reviewed the patient's chart and labs.  Questions were answered to the patient's satisfaction.     Regis Bill  Ok to proceed with colonoscopy

## 2022-10-30 ENCOUNTER — Encounter: Payer: Self-pay | Admitting: Gastroenterology

## 2022-10-30 NOTE — Anesthesia Postprocedure Evaluation (Signed)
Anesthesia Post Note  Patient: Andrea Contreras  Procedure(s) Performed: COLONOSCOPY WITH PROPOFOL  Patient location during evaluation: Endoscopy Anesthesia Type: General Level of consciousness: awake and alert Pain management: pain level controlled Vital Signs Assessment: post-procedure vital signs reviewed and stable Respiratory status: spontaneous breathing, nonlabored ventilation and respiratory function stable Cardiovascular status: blood pressure returned to baseline and stable Postop Assessment: no apparent nausea or vomiting Anesthetic complications: no   No notable events documented.   Last Vitals:  Vitals:   10/29/22 1214 10/29/22 1322  BP: (!) 177/90 (!) 102/53  Pulse: 77   Resp: 18   Temp: (!) 36 C (!) 35.6 C  SpO2: 100%     Last Pain:  Vitals:   10/29/22 1342  TempSrc:   PainSc: 0-No pain                 Foye Deer

## 2022-10-31 LAB — SURGICAL PATHOLOGY
